# Patient Record
Sex: Female | Born: 1980 | Race: White | Hispanic: No | Marital: Married | Smoking: Never smoker
Health system: Southern US, Community
[De-identification: ages and names within clinical notes are randomized; demographics above are authoritative.]

## PROBLEM LIST (undated history)

## (undated) DIAGNOSIS — I341 Nonrheumatic mitral (valve) prolapse: Secondary | ICD-10-CM

## (undated) DIAGNOSIS — E785 Hyperlipidemia, unspecified: Secondary | ICD-10-CM

## (undated) DIAGNOSIS — I498 Other specified cardiac arrhythmias: Secondary | ICD-10-CM

## (undated) DIAGNOSIS — I951 Orthostatic hypotension: Secondary | ICD-10-CM

## (undated) DIAGNOSIS — G90A Postural orthostatic tachycardia syndrome (POTS): Secondary | ICD-10-CM

## (undated) DIAGNOSIS — R000 Tachycardia, unspecified: Secondary | ICD-10-CM

## (undated) HISTORY — DX: Hyperlipidemia, unspecified: E78.5

## (undated) HISTORY — PX: APPENDECTOMY: SHX54

---

## 2001-06-29 ENCOUNTER — Other Ambulatory Visit: Admission: RE | Admit: 2001-06-29 | Discharge: 2001-06-29 | Payer: Self-pay | Admitting: Gynecology

## 2002-07-01 ENCOUNTER — Other Ambulatory Visit: Admission: RE | Admit: 2002-07-01 | Discharge: 2002-07-01 | Payer: Self-pay | Admitting: Gynecology

## 2003-07-04 ENCOUNTER — Other Ambulatory Visit: Admission: RE | Admit: 2003-07-04 | Discharge: 2003-07-04 | Payer: Self-pay | Admitting: Gynecology

## 2004-07-10 ENCOUNTER — Other Ambulatory Visit: Admission: RE | Admit: 2004-07-10 | Discharge: 2004-07-10 | Payer: Self-pay | Admitting: Gynecology

## 2005-08-08 ENCOUNTER — Other Ambulatory Visit: Admission: RE | Admit: 2005-08-08 | Discharge: 2005-08-08 | Payer: Self-pay | Admitting: Gynecology

## 2006-08-21 ENCOUNTER — Other Ambulatory Visit: Admission: RE | Admit: 2006-08-21 | Discharge: 2006-08-21 | Payer: Self-pay | Admitting: Gynecology

## 2007-02-26 ENCOUNTER — Other Ambulatory Visit: Admission: RE | Admit: 2007-02-26 | Discharge: 2007-02-26 | Payer: Self-pay | Admitting: Gynecology

## 2007-10-01 ENCOUNTER — Other Ambulatory Visit: Admission: RE | Admit: 2007-10-01 | Discharge: 2007-10-01 | Payer: Self-pay | Admitting: Gynecology

## 2008-07-03 ENCOUNTER — Ambulatory Visit (HOSPITAL_COMMUNITY): Admission: RE | Admit: 2008-07-03 | Discharge: 2008-07-03 | Payer: Self-pay | Admitting: Family Medicine

## 2008-12-01 ENCOUNTER — Encounter: Payer: Self-pay | Admitting: Women's Health

## 2008-12-01 ENCOUNTER — Other Ambulatory Visit: Admission: RE | Admit: 2008-12-01 | Discharge: 2008-12-01 | Payer: Self-pay | Admitting: Gynecology

## 2008-12-01 ENCOUNTER — Ambulatory Visit: Payer: Self-pay | Admitting: Women's Health

## 2009-12-14 HISTORY — PX: TRANSTHORACIC ECHOCARDIOGRAM: SHX275

## 2010-03-07 IMAGING — CR DG CHEST 2V
2 series · 2 of 2 positions shown · non-contrast
Comparison: None

CLINICAL DATA: Cough and congestion with shortness of breath.

CHEST - 2 VIEW

[view not recorded (1 of 2)]
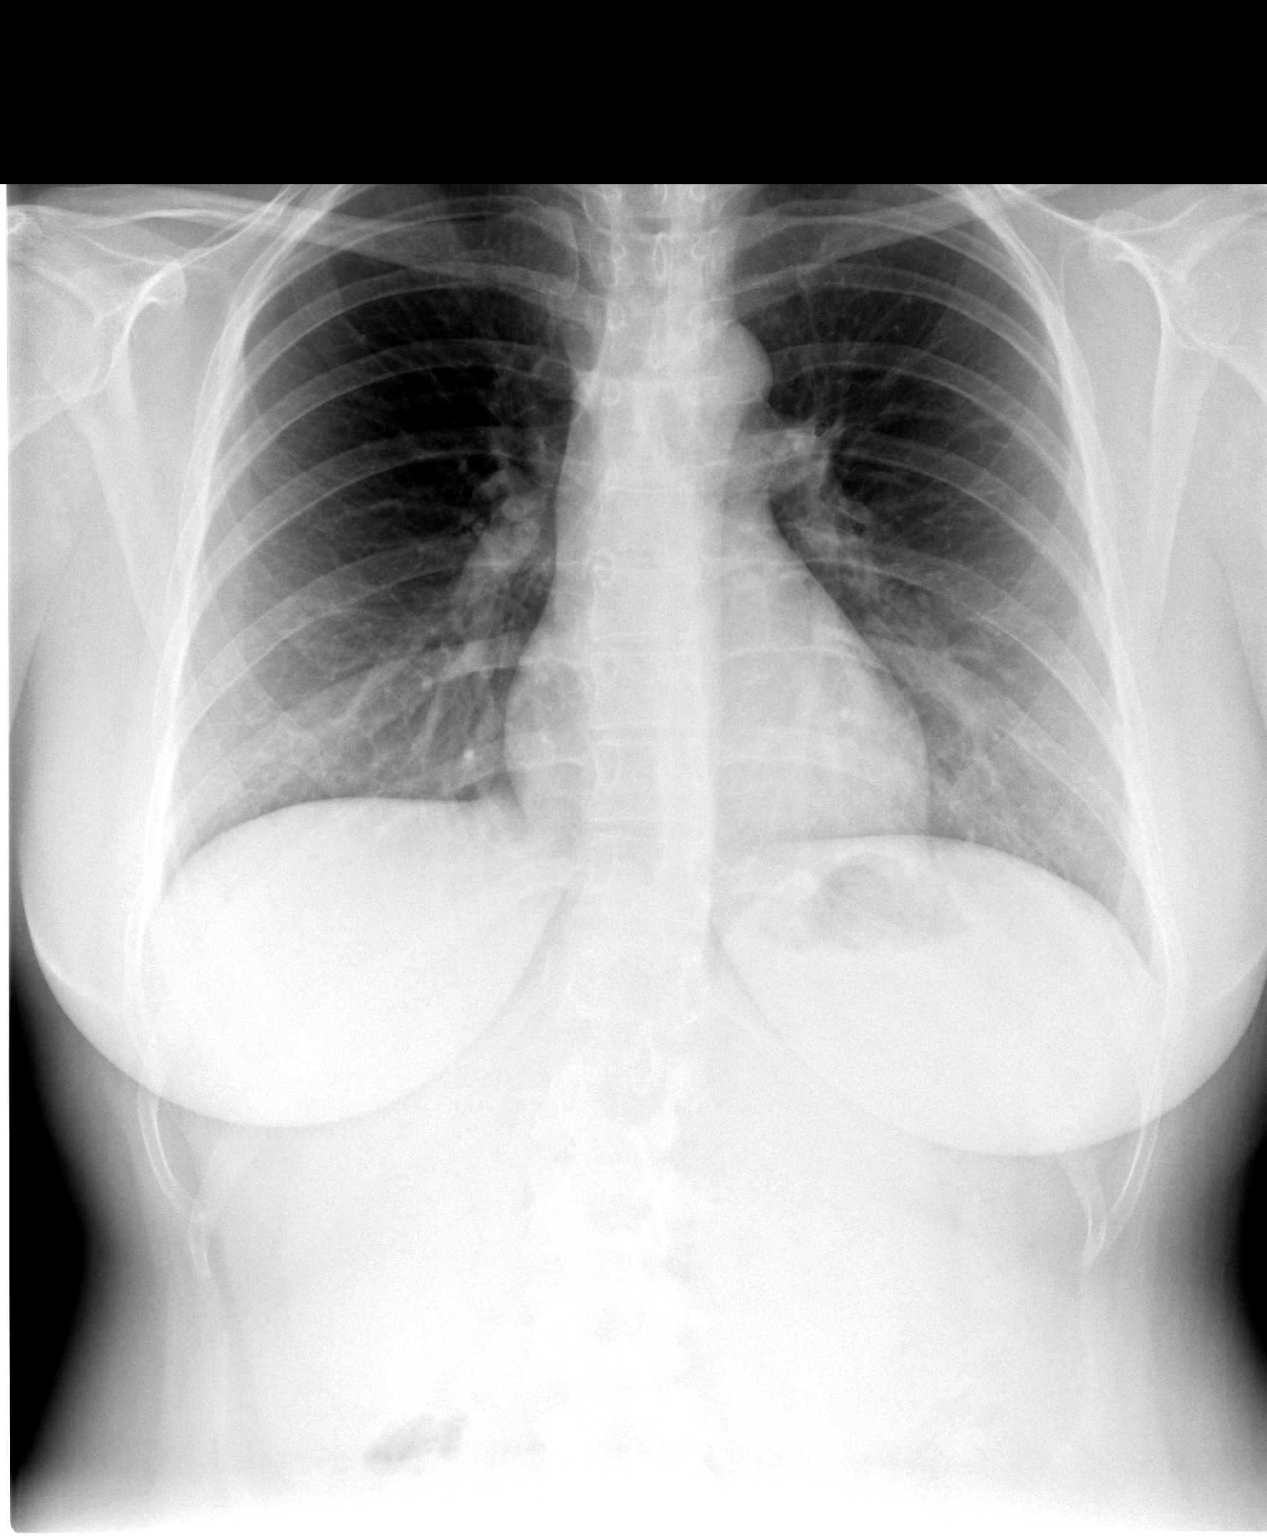

[view not recorded (2 of 2)]
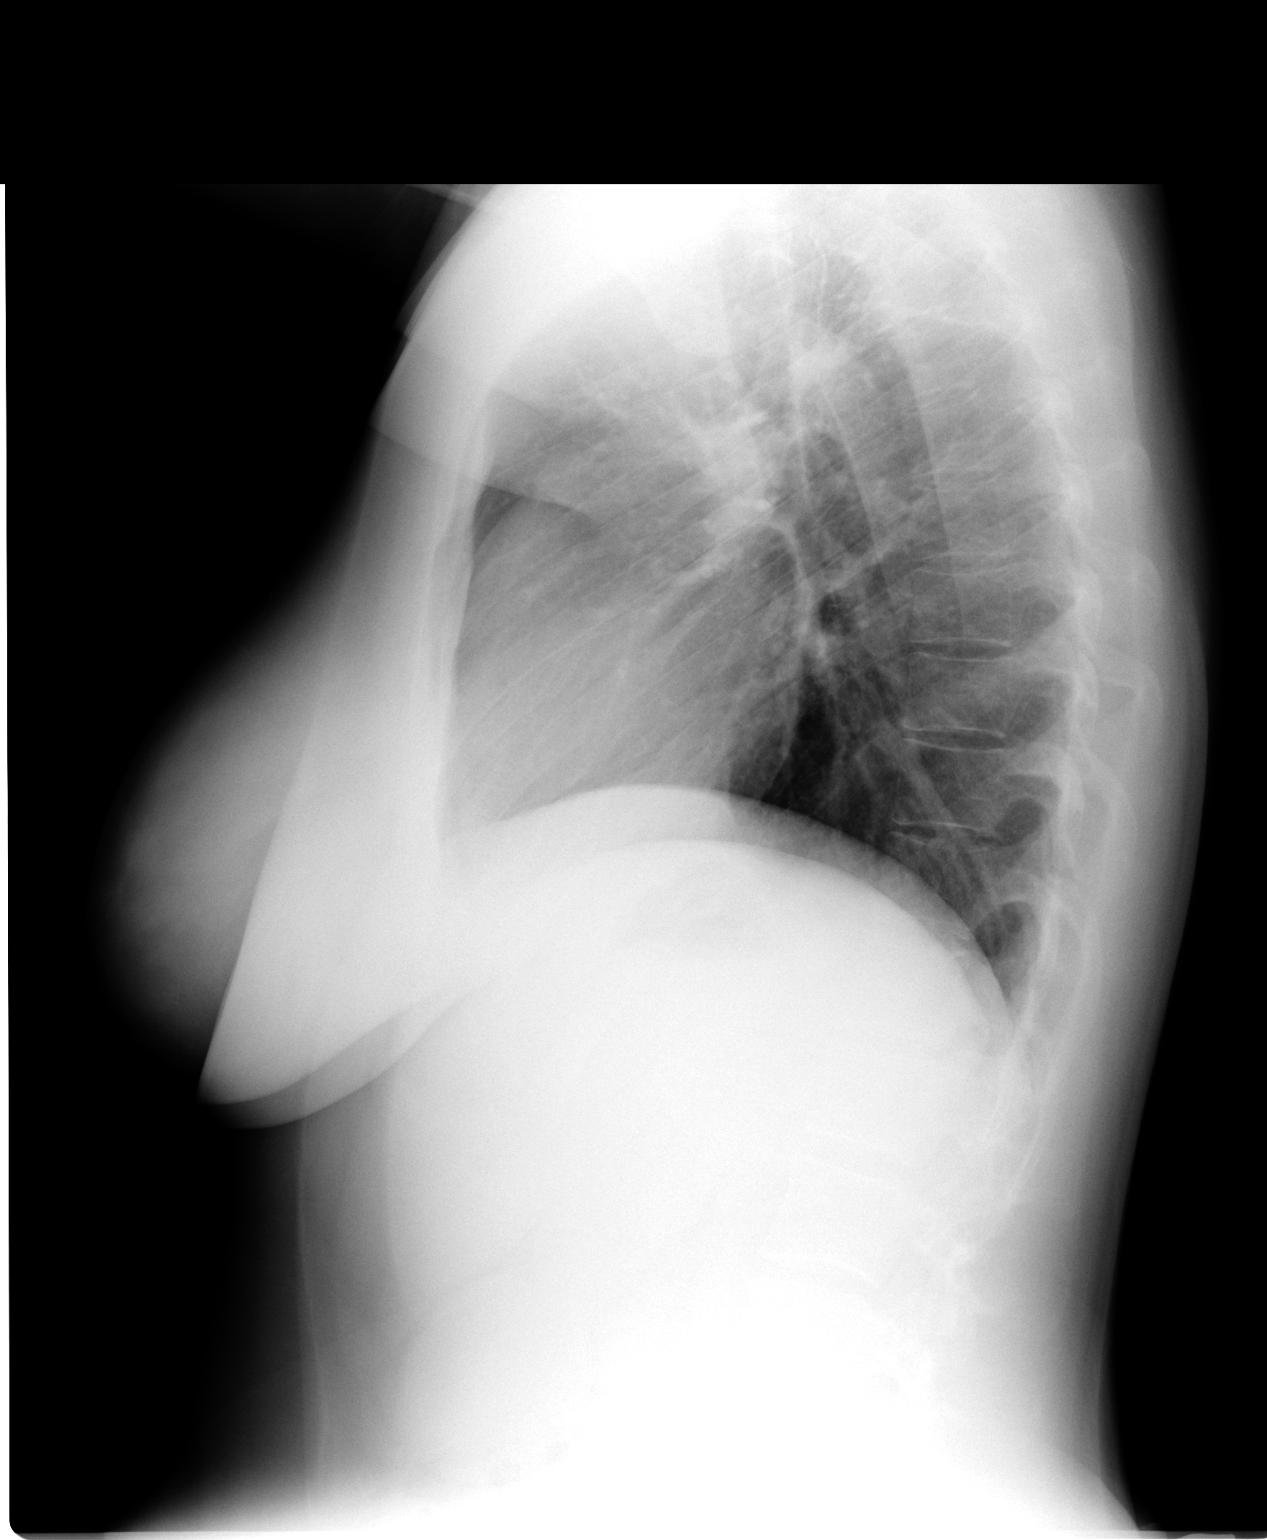

[2 of 2 positions shown; findings below may reference images not displayed]

FINDINGS: Trachea is midline.  Heart size normal.  Lungs are clear.
No pleural fluid.
IMPRESSION: No acute findings.

## 2011-05-02 LAB — OB RESULTS CONSOLE HIV ANTIBODY (ROUTINE TESTING): HIV: NONREACTIVE

## 2011-05-02 LAB — OB RESULTS CONSOLE ABO/RH: RH Type: POSITIVE

## 2011-05-02 LAB — OB RESULTS CONSOLE HEPATITIS B SURFACE ANTIGEN: Hepatitis B Surface Ag: NEGATIVE

## 2011-05-02 LAB — OB RESULTS CONSOLE RPR: RPR: NONREACTIVE

## 2011-06-17 NOTE — L&D Delivery Note (Signed)
Delivery Note At 10:39 PM a viable female was delivered via Vaginal, Spontaneous Delivery (Presentation: ;  ).  APGAR: , ; weight .   Placenta status: Intact, Spontaneous.  Cord:  with the following complications: .  Cord pH: not sent  Anesthesia: Epidural  Episiotomy: None Lacerations: superficial periurethral Suture Repair: 3.0 chromic Est. Blood Loss (mL): 300  Mom to postpartum.  Baby to nursery-stable.  Meriel Pica 12/08/2011, 10:48 PM

## 2011-09-24 ENCOUNTER — Encounter: Payer: BC Managed Care – PPO | Attending: Obstetrics and Gynecology | Admitting: *Deleted

## 2011-09-24 DIAGNOSIS — Z713 Dietary counseling and surveillance: Secondary | ICD-10-CM | POA: Insufficient documentation

## 2011-09-24 DIAGNOSIS — O99810 Abnormal glucose complicating pregnancy: Secondary | ICD-10-CM | POA: Insufficient documentation

## 2011-09-29 ENCOUNTER — Encounter: Payer: Self-pay | Admitting: *Deleted

## 2011-09-29 NOTE — Patient Instructions (Signed)
Goals:  Check glucose levels per MD as instructed  Follow Gestational Diabetes Diet as instructed  Call for follow-up as needed    

## 2011-09-29 NOTE — Progress Notes (Signed)
  Patient was seen on 09/24/2011 for Gestational Diabetes self-management class at the [facility]. The following learning objectives were met by the patient during this course:   States the definition of Gestational Diabetes  States why dietary management is important in controlling blood glucose  Describes the effects each nutrient has on blood glucose levels  Demonstrates ability to create a balanced meal plan  Demonstrates carbohydrate counting   States when to check blood glucose levels  Demonstrates proper blood glucose monitoring techniques  States the effect of stress and exercise on blood glucose levels  States the importance of limiting caffeine and abstaining from alcohol and smoking  Blood glucose monitor given:  One Touch Ultra Mini Self Monitoring Kit Lot # [id number] x Exp: 04/2012 Blood glucose reading: 97 mg/dl  Patient instructed to monitor glucose levels: FBS: 60 - <90 2 hour: <120  *Patient received handouts:  Nutrition Diabetes and Pregnancy  Carbohydrate Counting List  Patient will be seen for follow-up as needed.

## 2011-12-07 ENCOUNTER — Encounter (HOSPITAL_COMMUNITY): Payer: Self-pay | Admitting: *Deleted

## 2011-12-07 ENCOUNTER — Inpatient Hospital Stay (HOSPITAL_COMMUNITY)
Admission: AD | Admit: 2011-12-07 | Discharge: 2011-12-10 | DRG: 372 | Disposition: A | Discharge status: Home / Self Care | Payer: BC Managed Care – PPO | Attending: Obstetrics and Gynecology | Admitting: Obstetrics and Gynecology

## 2011-12-07 DIAGNOSIS — I059 Rheumatic mitral valve disease, unspecified: Secondary | ICD-10-CM | POA: Diagnosis present

## 2011-12-07 DIAGNOSIS — I2510 Atherosclerotic heart disease of native coronary artery without angina pectoris: Secondary | ICD-10-CM | POA: Diagnosis present

## 2011-12-07 DIAGNOSIS — E119 Type 2 diabetes mellitus without complications: Secondary | ICD-10-CM | POA: Diagnosis present

## 2011-12-07 DIAGNOSIS — O2432 Unspecified pre-existing diabetes mellitus in childbirth: Secondary | ICD-10-CM | POA: Diagnosis present

## 2011-12-07 HISTORY — DX: Tachycardia, unspecified: R00.0

## 2011-12-07 HISTORY — DX: Orthostatic hypotension: I95.1

## 2011-12-07 HISTORY — DX: Postural orthostatic tachycardia syndrome (POTS): G90.A

## 2011-12-07 HISTORY — DX: Nonrheumatic mitral (valve) prolapse: I34.1

## 2011-12-07 HISTORY — DX: Other specified cardiac arrhythmias: I49.8

## 2011-12-07 NOTE — MAU Note (Signed)
Pt report LOF since 2120

## 2011-12-07 NOTE — H&P (Signed)
31 year old G 1 at 74 w 4 days presents with questionable SROM tonight. No big gush . Mild contractions  Afebrile vss Fetal heart rate is reactive Toco irregular UCs Cervix fern neg slide x 2  Cervix 80/3 -2 No pool amniosure is performed  IMPRESSION: Rule out SROM PLAN: Amniosure If positive will admit  If negative, will ambulate for 1 to 2 hours

## 2011-12-08 ENCOUNTER — Encounter (HOSPITAL_COMMUNITY): Payer: Self-pay | Admitting: *Deleted

## 2011-12-08 ENCOUNTER — Encounter (HOSPITAL_COMMUNITY): Payer: Self-pay | Admitting: Anesthesiology

## 2011-12-08 ENCOUNTER — Inpatient Hospital Stay (HOSPITAL_COMMUNITY): Payer: BC Managed Care – PPO | Admitting: Anesthesiology

## 2011-12-08 LAB — CBC
Platelets: 203 10*3/uL (ref 150–400)
RBC: 4.02 MIL/uL (ref 3.87–5.11)
RDW: 13.7 % (ref 11.5–15.5)
WBC: 11.1 10*3/uL — ABNORMAL HIGH (ref 4.0–10.5)

## 2011-12-08 LAB — RPR: RPR Ser Ql: NONREACTIVE

## 2011-12-08 MED ORDER — FENTANYL 2.5 MCG/ML BUPIVACAINE 1/10 % EPIDURAL INFUSION (WH - ANES)
14.0000 mL/h | INTRAMUSCULAR | Status: DC
  Administered 2011-12-08 (×3): 14 mL/h via EPIDURAL
  Filled 2011-12-08 (×3): qty 60

## 2011-12-08 MED ORDER — OXYTOCIN 40 UNITS IN LACTATED RINGERS INFUSION - SIMPLE MED: 1.0000 m[IU]/min | INTRAVENOUS | Status: DC

## 2011-12-08 MED ORDER — OXYTOCIN BOLUS FROM INFUSION
250.0000 mL | Freq: Once | INTRAVENOUS | Status: DC
  Filled 2011-12-08: qty 500

## 2011-12-08 MED ORDER — PHENYLEPHRINE 40 MCG/ML (10ML) SYRINGE FOR IV PUSH (FOR BLOOD PRESSURE SUPPORT): 80.0000 ug | PREFILLED_SYRINGE | INTRAVENOUS | Status: DC | PRN

## 2011-12-08 MED ORDER — TERBUTALINE SULFATE 1 MG/ML IJ SOLN: 0.2500 mg | Freq: Once | INTRAMUSCULAR | Status: AC | PRN

## 2011-12-08 MED ORDER — SODIUM CHLORIDE 0.9 % IV SOLN
2.0000 g | Freq: Four times a day (QID) | INTRAVENOUS | Status: AC
  Administered 2011-12-09 (×2): 2 g via INTRAVENOUS
  Filled 2011-12-08 (×2): qty 2000

## 2011-12-08 MED ORDER — SODIUM CHLORIDE 0.9 % IV SOLN
2.0000 g | Freq: Once | INTRAVENOUS | Status: AC
  Administered 2011-12-08: 2 g via INTRAVENOUS
  Filled 2011-12-08: qty 2000

## 2011-12-08 MED ORDER — ACETAMINOPHEN 500 MG PO TABS
1000.0000 mg | ORAL_TABLET | Freq: Once | ORAL | Status: AC
  Administered 2011-12-08: 1000 mg via ORAL
  Filled 2011-12-08: qty 2

## 2011-12-08 MED ORDER — LACTATED RINGERS IV SOLN
INTRAVENOUS | Status: DC
  Administered 2011-12-07 – 2011-12-08 (×3): via INTRAVENOUS

## 2011-12-08 MED ORDER — OXYTOCIN 40 UNITS IN LACTATED RINGERS INFUSION - SIMPLE MED: 62.5000 mL/h | Freq: Once | INTRAVENOUS | Status: DC

## 2011-12-08 MED ORDER — CITRIC ACID-SODIUM CITRATE 334-500 MG/5ML PO SOLN: 30.0000 mL | ORAL | Status: DC | PRN

## 2011-12-08 MED ORDER — DIPHENHYDRAMINE HCL 50 MG/ML IJ SOLN: 12.5000 mg | INTRAMUSCULAR | Status: DC | PRN

## 2011-12-08 MED ORDER — ACETAMINOPHEN 325 MG PO TABS: 650.0000 mg | ORAL_TABLET | ORAL | Status: DC | PRN

## 2011-12-08 MED ORDER — FLEET ENEMA 7-19 GM/118ML RE ENEM: 1.0000 | ENEMA | RECTAL | Status: DC | PRN

## 2011-12-08 MED ORDER — LIDOCAINE HCL (PF) 1 % IJ SOLN
30.0000 mL | INTRAMUSCULAR | Status: DC | PRN
  Filled 2011-12-08: qty 30

## 2011-12-08 MED ORDER — PHENYLEPHRINE 40 MCG/ML (10ML) SYRINGE FOR IV PUSH (FOR BLOOD PRESSURE SUPPORT)
80.0000 ug | PREFILLED_SYRINGE | INTRAVENOUS | Status: DC | PRN
  Filled 2011-12-08: qty 5

## 2011-12-08 MED ORDER — ONDANSETRON HCL 4 MG/2ML IJ SOLN: 4.0000 mg | Freq: Four times a day (QID) | INTRAMUSCULAR | Status: DC | PRN

## 2011-12-08 MED ORDER — IBUPROFEN 600 MG PO TABS: 600.0000 mg | ORAL_TABLET | Freq: Four times a day (QID) | ORAL | Status: DC | PRN

## 2011-12-08 MED ORDER — OXYTOCIN 40 UNITS IN LACTATED RINGERS INFUSION - SIMPLE MED
1.0000 m[IU]/min | INTRAVENOUS | Status: DC
  Administered 2011-12-08: 2 m[IU]/min via INTRAVENOUS
  Administered 2011-12-08: 10 m[IU]/min via INTRAVENOUS
  Filled 2011-12-08: qty 1000

## 2011-12-08 MED ORDER — EPHEDRINE 5 MG/ML INJ
10.0000 mg | INTRAVENOUS | Status: DC | PRN
  Filled 2011-12-08: qty 4

## 2011-12-08 MED ORDER — LIDOCAINE HCL (PF) 1 % IJ SOLN
INTRAMUSCULAR | Status: DC | PRN
  Administered 2011-12-08 (×2): 5 mL

## 2011-12-08 MED ORDER — LACTATED RINGERS IV SOLN: 500.0000 mL | INTRAVENOUS | Status: DC | PRN

## 2011-12-08 MED ORDER — OXYCODONE-ACETAMINOPHEN 5-325 MG PO TABS: 1.0000 | ORAL_TABLET | ORAL | Status: DC | PRN

## 2011-12-08 MED ORDER — LACTATED RINGERS IV SOLN
500.0000 mL | Freq: Once | INTRAVENOUS | Status: AC
  Administered 2011-12-08: 500 mL via INTRAVENOUS

## 2011-12-08 MED ORDER — EPHEDRINE 5 MG/ML INJ: 10.0000 mg | INTRAVENOUS | Status: DC | PRN

## 2011-12-08 NOTE — Progress Notes (Signed)
amniosure was positive Patient will be admitted Contractions now every 3 Patient has history of POTS

## 2011-12-08 NOTE — Anesthesia Procedure Notes (Signed)
Epidural Patient location during procedure: OB Start time: 12/08/2011 2:50 PM  Staffing Anesthesiologist: Brayton Caves R Performed by: anesthesiologist   Preanesthetic Checklist Completed: patient identified, site marked, surgical consent, pre-op evaluation, timeout performed, IV checked, risks and benefits discussed and monitors and equipment checked  Epidural Patient position: sitting Prep: site prepped and draped and DuraPrep Patient monitoring: continuous pulse ox and blood pressure Approach: midline Injection technique: LOR air and LOR saline  Needle:  Needle type: Tuohy  Needle gauge: 17 G Needle length: 9 cm Needle insertion depth: 5 cm cm Catheter type: closed end flexible Catheter size: 19 Gauge Catheter at skin depth: 10 cm Test dose: negative  Assessment Events: blood not aspirated, injection not painful, no injection resistance, negative IV test and no paresthesia  Additional Notes Patient identified.  Risk benefits discussed including failed block, incomplete pain control, headache, nerve damage, paralysis, blood pressure changes, nausea, vomiting, reactions to medication both toxic or allergic, and postpartum back pain.  Patient expressed understanding and wished to proceed.  All questions were answered.  Sterile technique used throughout procedure and epidural site dressed with sterile barrier dressing. No paresthesia or other complications noted.The patient did not experience any signs of intravascular injection such as tinnitus or metallic taste in mouth nor signs of intrathecal spread such as rapid motor block. Please see nursing notes for vital signs.

## 2011-12-08 NOTE — Progress Notes (Signed)
Started pushing ~ 10 min ago, Temp 101>>started on Iv Ampicillin + PO Tylenol. Stable FHR, feels to be ROP @ +1

## 2011-12-08 NOTE — H&P (Signed)
Brittany Sampson is an 31 y.o. female. G1P0, adm with hx poss ROM, + amnisure, adm for SROM  Pertinent Gyneco Menstrual Histor No LMP recorded. Patient is pregnant.    Past Medical History  Diagnosis Date  . Hyperlipidemia   . Diabetes mellitus   . Mitral valve prolapse   . POTS (postural orthostatic tachycardia syndrome)   . Hyperlipidemia     Past Surgical History  Procedure Date  . Appendectomy   . Appendectomy     Family History  Problem Relation Age of Onset  . Other Neg Hx     Social History:  reports that she has never smoked. She does not have any smokeless tobacco history on file. She reports that she does not drink alcohol or use illicit drugs.  Allergies: No Known Allergies  Prescriptions prior to admission  Medication Sig Dispense Refill  . cetirizine (ZYRTEC) 10 MG tablet Take 10 mg by mouth daily.      . Prenatal Vit-Fe Fumarate-FA (PRENATAL MULTIVITAMIN) TABS Take 1 tablet by mouth at bedtime.        ROS  Blood pressure 123/70, pulse 99, temperature 98.5 F (36.9 C), temperature source Oral, resp. rate 18, height 5\' 3"  (1.6 m), weight 78.019 kg (172 lb). Physical Exam  Constitutional: She is oriented to person, place, and time. She appears well-developed and well-nourished.  HENT:  Head: Normocephalic and atraumatic.  Neck: Normal range of motion. Neck supple.  Cardiovascular: Normal rate and regular rhythm.   Respiratory: Effort normal and breath sounds normal.  GI:       Term FH, FHR 148  Genitourinary:       4/75%/-2/ AROM forebag  Musculoskeletal: Normal range of motion.  Neurological: She is alert and oriented to person, place, and time.    Results for orders placed during the hospital encounter of 12/07/11 (from the past 24 hour(s))  POCT FERN TEST     Status: Normal   Collection Time   12/07/11 11:30 PM      Component Value Range   Fern Test Negative    AMNISURE RUPTURE OF MEMBRANE (ROM)     Status: Normal   Collection Time   12/07/11 11:40 PM      Component Value Range   Amnisure ROM POSITIVE    CBC     Status: Abnormal   Collection Time   12/08/11 12:50 AM      Component Value Range   WBC 11.1 (*) 4.0 - 10.5 K/uL   RBC 4.02  3.87 - 5.11 MIL/uL   Hemoglobin 11.9 (*) 12.0 - 15.0 g/dL   HCT 47.8 (*) 29.5 - 62.1 %   MCV 86.8  78.0 - 100.0 fL   MCH 29.6  26.0 - 34.0 pg   MCHC 34.1  30.0 - 36.0 g/dL   RDW 30.8  65.7 - 84.6 %   Platelets 203  150 - 400 K/uL    No results found.  Assessment/Plan: SROM at term, - GBS, pit aug protocol discussed  Meriel Pica 12/08/2011, 7:42 AM

## 2011-12-08 NOTE — Anesthesia Preprocedure Evaluation (Signed)
Anesthesia Evaluation  Patient identified by MRN, date of birth, ID band Patient awake    Reviewed: Allergy & Precautions, H&P , Patient's Chart, lab work & pertinent test results  Airway Mallampati: II TM Distance: >3 FB Neck ROM: full    Dental No notable dental hx.    Pulmonary neg pulmonary ROS,  breath sounds clear to auscultation  Pulmonary exam normal       Cardiovascular negative cardio ROS  Rhythm:regular Rate:Normal     Neuro/Psych negative neurological ROS  negative psych ROS   GI/Hepatic negative GI ROS, Neg liver ROS,   Endo/Other  negative endocrine ROS  Renal/GU negative Renal ROS     Musculoskeletal   Abdominal   Peds  Hematology negative hematology ROS (+)   Anesthesia Other Findings Hyperlipidemia     Diabetes mellitus        Mitral valve prolapse     POTS (postural orthostatic tachycardia syndrome)        Hyperlipidemia    Reproductive/Obstetrics (+) Pregnancy                           Anesthesia Physical Anesthesia Plan  ASA: III  Anesthesia Plan: Epidural   Post-op Pain Management:    Induction:   Airway Management Planned:   Additional Equipment:   Intra-op Plan:   Post-operative Plan:   Informed Consent: I have reviewed the patients History and Physical, chart, labs and discussed the procedure including the risks, benefits and alternatives for the proposed anesthesia with the patient or authorized representative who has indicated his/her understanding and acceptance.     Plan Discussed with:   Anesthesia Plan Comments:         Anesthesia Quick Evaluation

## 2011-12-09 LAB — CBC
HCT: 32.5 % — ABNORMAL LOW (ref 36.0–46.0)
Hemoglobin: 11 g/dL — ABNORMAL LOW (ref 12.0–15.0)
MCH: 29.6 pg (ref 26.0–34.0)
MCHC: 33.8 g/dL (ref 30.0–36.0)

## 2011-12-09 LAB — TYPE AND SCREEN: Antibody Screen: NEGATIVE

## 2011-12-09 MED ORDER — FLEET ENEMA 7-19 GM/118ML RE ENEM: 1.0000 | ENEMA | Freq: Every day | RECTAL | Status: DC | PRN

## 2011-12-09 MED ORDER — DIPHENHYDRAMINE HCL 25 MG PO CAPS: 25.0000 mg | ORAL_CAPSULE | Freq: Four times a day (QID) | ORAL | Status: DC | PRN

## 2011-12-09 MED ORDER — DIBUCAINE 1 % RE OINT: 1.0000 "application " | TOPICAL_OINTMENT | RECTAL | Status: DC | PRN

## 2011-12-09 MED ORDER — PRENATAL MULTIVITAMIN CH
1.0000 | ORAL_TABLET | Freq: Every day | ORAL | Status: DC
  Administered 2011-12-09: 1 via ORAL
  Filled 2011-12-09 (×2): qty 1

## 2011-12-09 MED ORDER — IBUPROFEN 800 MG PO TABS
800.0000 mg | ORAL_TABLET | Freq: Three times a day (TID) | ORAL | Status: DC | PRN
  Administered 2011-12-09 – 2011-12-10 (×4): 800 mg via ORAL
  Filled 2011-12-09 (×4): qty 1

## 2011-12-09 MED ORDER — ONDANSETRON HCL 4 MG PO TABS: 4.0000 mg | ORAL_TABLET | ORAL | Status: DC | PRN

## 2011-12-09 MED ORDER — OXYCODONE-ACETAMINOPHEN 5-325 MG PO TABS: 1.0000 | ORAL_TABLET | Freq: Four times a day (QID) | ORAL | Status: DC | PRN

## 2011-12-09 MED ORDER — TETANUS-DIPHTH-ACELL PERTUSSIS 5-2.5-18.5 LF-MCG/0.5 IM SUSP: 0.5000 mL | Freq: Once | INTRAMUSCULAR | Status: DC

## 2011-12-09 MED ORDER — SIMETHICONE 80 MG PO CHEW: 80.0000 mg | CHEWABLE_TABLET | ORAL | Status: DC | PRN

## 2011-12-09 MED ORDER — BENZOCAINE-MENTHOL 20-0.5 % EX AERO
1.0000 "application " | INHALATION_SPRAY | CUTANEOUS | Status: DC | PRN
  Administered 2011-12-09: 1 via TOPICAL
  Filled 2011-12-09: qty 56

## 2011-12-09 MED ORDER — SENNOSIDES-DOCUSATE SODIUM 8.6-50 MG PO TABS
2.0000 | ORAL_TABLET | Freq: Every day | ORAL | Status: DC
  Administered 2011-12-09: 2 via ORAL

## 2011-12-09 MED ORDER — ONDANSETRON HCL 4 MG/2ML IJ SOLN: 4.0000 mg | INTRAMUSCULAR | Status: DC | PRN

## 2011-12-09 MED ORDER — MEASLES, MUMPS & RUBELLA VAC ~~LOC~~ INJ
0.5000 mL | INJECTION | Freq: Once | SUBCUTANEOUS | Status: DC
  Filled 2011-12-09: qty 0.5

## 2011-12-09 MED ORDER — ZOLPIDEM TARTRATE 5 MG PO TABS: 5.0000 mg | ORAL_TABLET | Freq: Every evening | ORAL | Status: DC | PRN

## 2011-12-09 MED ORDER — BISACODYL 10 MG RE SUPP: 10.0000 mg | Freq: Every day | RECTAL | Status: DC | PRN

## 2011-12-09 MED ORDER — WITCH HAZEL-GLYCERIN EX PADS
1.0000 "application " | MEDICATED_PAD | CUTANEOUS | Status: DC | PRN
  Administered 2011-12-09: 1 via TOPICAL

## 2011-12-09 MED ORDER — LANOLIN HYDROUS EX OINT: TOPICAL_OINTMENT | CUTANEOUS | Status: DC | PRN

## 2011-12-09 NOTE — Anesthesia Postprocedure Evaluation (Signed)
  Anesthesia Post-op Note  Patient: Brittany Sampson  Procedure(s) Performed: * No surgery found *  Patient Location: Mother/Baby  Anesthesia Type: Epidural  Level of Consciousness: awake  Airway and Oxygen Therapy: Patient Spontanous Breathing  Post-op Pain: none  Post-op Assessment: Post-op Vital signs reviewed  Post-op Vital Signs: Reviewed and stable  Complications: No apparent anesthesia complications

## 2011-12-09 NOTE — Clinical Social Work Psychosocial (Signed)
     Clinical Social Work Department BRIEF PSYCHOSOCIAL ASSESSMENT 12/09/2011  Patient:  Brittany Sampson, Brittany Sampson     Account Number:  [id number]     Admit date:  12/07/2011  Clinical Social Worker:  Andy Gauss  Date/Time:  12/09/2011 11:00 AM  Referred by:  Physician  Date Referred:  12/09/2011 Referred for  Behavioral Health Issues   Other Referral:   Interview type:  Family Other interview type:    PSYCHOSOCIAL DATA Living Status:  HUSBAND Admitted from facility:   Level of care:   Primary support name:  Bettina Warn Primary support relationship to patient:  SPOUSE Degree of support available:   Involved    CURRENT CONCERNS Current Concerns  Behavioral Health Issues   Other Concerns:    SOCIAL WORK ASSESSMENT / PLAN Sw referral received to assess history of anxiety.  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 Pt states she is no longer required to travel and therefore anxiety symptoms resolved.  Pt was prescribed medication but has not taking any in "over a year."  She reports having good support at home.  Pt appears to be happy, as Sw observed pt and FOB bonding well with the infant.  Sw available to assist further if needed.   Assessment/plan status:   Other assessment/ plan:   Information/referral to community resources:   PP depression symptoms discussed briefly.  Pt agrees to follow up with medical provider if needed.    PATIENTS/FAMILYS RESPONSE TO PLAN OF CARE: Pt and spouse thanked Sw for consult.

## 2011-12-09 NOTE — Anesthesia Postprocedure Evaluation (Signed)
  Anesthesia Post-op Note  Patient: Brittany Sampson  Procedure(s) Performed: * No surgery found *  Patient Location: Mother/Baby  Anesthesia Type: Epidural  Level of Consciousness: awake  Airway and Oxygen Therapy: Patient Spontanous Breathing  Post-op Pain: none  Post-op Assessment: Post-op Vital signs reviewed and Patient's Cardiovascular Status Stable  Post-op Vital Signs: Reviewed and stable  Complications: No apparent anesthesia complications

## 2011-12-09 NOTE — Progress Notes (Signed)
Post Partum Day 1 Subjective: no complaints, up ad lib, voiding, tolerating PO, + flatus and denies HA, blurred vision. No problem with elevated BP during  pregnancy  Objective: Blood pressure 130/88, pulse 114, temperature 98.5 F (36.9 C), temperature source Oral, resp. rate 16, height 5\' 3"  (1.6 m), weight 78.019 kg (172 lb), SpO2 98.00%, unknown if currently breastfeeding.  Physical Exam:  General: alert and cooperative Lochia: appropriate Uterine Fundus: firm Incision: perineum intact DVT Evaluation: No evidence of DVT seen on physical exam. DTR's 3+ no clonus, no pedal edema observed  Basename 12/09/11 0535 12/08/11 0050  HGB 11.0* 11.9*  HCT 32.5* 34.9*    Assessment/Plan: Plan for discharge tomorrow CBC and CMP in am.  Continue ATB's for 24 hrs   LOS: 2 days   CURTIS,CAROL G 12/09/2011, 8:12 AM

## 2011-12-10 LAB — CBC
HCT: 29 % — ABNORMAL LOW (ref 36.0–46.0)
MCHC: 33.1 g/dL (ref 30.0–36.0)
MCV: 88.1 fL (ref 78.0–100.0)
RDW: 14 % (ref 11.5–15.5)

## 2011-12-10 LAB — COMPREHENSIVE METABOLIC PANEL
Albumin: 2.2 g/dL — ABNORMAL LOW (ref 3.5–5.2)
BUN: 14 mg/dL (ref 6–23)
Creatinine, Ser: 0.57 mg/dL (ref 0.50–1.10)
Total Protein: 5.5 g/dL — ABNORMAL LOW (ref 6.0–8.3)

## 2011-12-10 MED ORDER — IBUPROFEN 800 MG PO TABS: 800.0000 mg | ORAL_TABLET | Freq: Three times a day (TID) | ORAL | Status: AC | PRN

## 2011-12-10 NOTE — Progress Notes (Signed)
Post Partum Day 2 Subjective: no complaints, up ad lib, voiding, tolerating PO and + flatus  Objective: Blood pressure 112/73, pulse 87, temperature 98.3 F (36.8 C), temperature source Oral, resp. rate 18, height 5\' 3"  (1.6 m), weight 78.019 kg (172 lb), SpO2 98.00%, unknown if currently breastfeeding.  Physical Exam:  General: alert and cooperative Lochia: appropriate Uterine Fundus: firm Incision: perineum intact, small hemorrhoids DVT Evaluation: No evidence of DVT seen on physical exam.   Basename 12/10/11 0530 12/09/11 0535  HGB 9.6* 11.0*  HCT 29.0* 32.5*    Assessment/Plan: Discharge home   LOS: 3 days   Brittany Sampson G 12/10/2011, 7:59 AM

## 2011-12-10 NOTE — Discharge Summary (Signed)
Obstetric Discharge Summary Reason for Admission: rupture of membranes Prenatal Procedures: ultrasound Intrapartum Procedures: spontaneous vaginal delivery Postpartum Procedures: antibiotics Complications-Operative and Postpartum: early endometritis Hemoglobin  Date Value Range Status  12/10/2011 9.6* 12.0 - 15.0 Sampson/dL Final     HCT  Date Value Range Status  12/10/2011 29.0* 36.0 - 46.0 % Final    Physical Exam:  General: alert and cooperative Lochia: appropriate Uterine Fundus: firm Incision: perineum intact, small hemorrhoids DVT Evaluation: No evidence of DVT seen on physical exam.  Discharge Diagnoses: Term Pregnancy-delivered  Discharge Information: Date: 12/10/2011 Activity: pelvic rest Diet: routine Medications: PNV and Ibuprofen Condition: improved Instructions: refer to practice specific booklet Discharge to: home   Newborn Data: Live born female  Birth Weight: 6 lb 10 oz (3005 Sampson) APGAR: 9, 9  Home with mother.  Brittany Sampson 12/10/2011, 8:10 AM

## 2013-04-25 ENCOUNTER — Other Ambulatory Visit: Payer: Self-pay | Admitting: *Deleted

## 2013-04-25 MED ORDER — FENOFIBRATE 145 MG PO TABS: 145.0000 mg | ORAL_TABLET | Freq: Every day | ORAL | Status: DC

## 2013-07-15 ENCOUNTER — Telehealth: Payer: Self-pay | Admitting: *Deleted

## 2013-07-15 MED ORDER — FENOFIBRATE 145 MG PO TABS: 145.0000 mg | ORAL_TABLET | Freq: Every day | ORAL | Status: DC

## 2013-07-15 NOTE — Telephone Encounter (Signed)
Returned patient's call and notified that I will send a rx into her pharmacy to last until her appt in March.   Rx was sent to pharmacy electronically.

## 2013-07-15 NOTE — Telephone Encounter (Signed)
Pt has been out of her Tricor for over a month. She has an appointment with Dr. Salena Saner on March 6th. She stated that she did not know if he wanted her to continue to take it or not.  MC

## 2013-07-18 ENCOUNTER — Telehealth: Payer: Self-pay | Admitting: Cardiovascular Disease

## 2013-07-18 NOTE — Telephone Encounter (Signed)
Her insurance will not pay for Fenofibrate 145 mg,want to know if she can change to Gemfibraziil 600 mg please?

## 2013-07-18 NOTE — Telephone Encounter (Signed)
Called pharmacy.Marland Kitchen Appears that fenofibrate 154m is running thru insurance but that patient will have to pay full cost. Was unable to obtain information from pharmacy staff if a prior authorization is needed or that maybe patient has a deductible needing to be met.   Called patient & left VM with this information and inquired if she knows any more information/can contact insurance company to see if this will be covered.   Message sent to Dr. CLurline Delnurse BPamala Hurryas FJuluis Rainier

## 2013-07-18 NOTE — Telephone Encounter (Signed)
Spoke with patient. Patient informed RN that she was told fenofibrate was not preferred but that fenofibric acid was preferred. Seemed like there was confusion on information provided to RN & patient. Patient stated she does not have prescription drug deductible and that this medication works for her & she would like to stay on it if possible. informed patient that it may be an issues of it not being preferred, so it is not falling into one of her co-pay categroies and instead may be a 10% of price charge (i.e. She would have to pay $55 for the medication - she stated this was the cost)  [facility]. [facility] staff stated that when running the drug, it did not appear that a prior authorization was needed, but a message stating this was not a preferred drug. Staff provided RN with express scripts number [id number].   Will defer to B. Lassiter to address.

## 2013-07-20 NOTE — Telephone Encounter (Signed)
Has an appt 08/19/13.  Will discuss Fenofibrate/Gemfibrozil with Dr. Salena Saner at this visit.  Insurance formulary is Gemfibrozil.

## 2013-07-25 ENCOUNTER — Other Ambulatory Visit: Payer: Self-pay | Admitting: *Deleted

## 2013-07-25 MED ORDER — FENOFIBRATE 160 MG PO TABS: 160.0000 mg | ORAL_TABLET | Freq: Every day | ORAL | Status: DC

## 2013-07-25 NOTE — Telephone Encounter (Signed)
Notified patient Fenofibrate has been changed to 160mg .  BUT - she thinks this is too high and doesn't want to take. Suggested she hold off picking it up and discuss with Dr. Salena Saner at the office visit.

## 2013-08-19 ENCOUNTER — Ambulatory Visit (INDEPENDENT_AMBULATORY_CARE_PROVIDER_SITE_OTHER): Payer: No Typology Code available for payment source | Admitting: Cardiovascular Disease

## 2013-08-19 ENCOUNTER — Encounter: Payer: Self-pay | Admitting: Cardiovascular Disease

## 2013-08-19 VITALS — BP 136/90 | HR 95 | Resp 20 | Ht 63.0 in | Wt 149.1 lb

## 2013-08-19 DIAGNOSIS — E781 Pure hyperglyceridemia: Secondary | ICD-10-CM

## 2013-08-19 DIAGNOSIS — G90A Postural orthostatic tachycardia syndrome (POTS): Secondary | ICD-10-CM

## 2013-08-19 DIAGNOSIS — I498 Other specified cardiac arrhythmias: Secondary | ICD-10-CM

## 2013-08-19 DIAGNOSIS — I951 Orthostatic hypotension: Secondary | ICD-10-CM

## 2013-08-19 DIAGNOSIS — R000 Tachycardia, unspecified: Secondary | ICD-10-CM

## 2013-08-19 NOTE — Patient Instructions (Signed)
Your physician recommends that you schedule a follow-up appointment in: ONE YEAR with Dr.Croitoru  

## 2013-08-21 ENCOUNTER — Encounter: Payer: Self-pay | Admitting: Cardiovascular Disease

## 2013-08-21 DIAGNOSIS — G90A Postural orthostatic tachycardia syndrome (POTS): Secondary | ICD-10-CM | POA: Insufficient documentation

## 2013-08-21 DIAGNOSIS — R000 Tachycardia, unspecified: Secondary | ICD-10-CM

## 2013-08-21 DIAGNOSIS — I951 Orthostatic hypotension: Secondary | ICD-10-CM

## 2013-08-21 DIAGNOSIS — E781 Pure hyperglyceridemia: Secondary | ICD-10-CM | POA: Insufficient documentation

## 2013-08-21 NOTE — Progress Notes (Signed)
Patient ID: Brittany Sampson, female   DOB: 12/14/1980, 33 y.o.   MRN: [id number]     Reason for office visit Postural orthostatic tachycardia syndrome  Brittany Sampson is tolerating her POTS quite well. She is not troubled by palpitations, dizziness, breathlessness or syncope. At work she'll sometimes check her heart rate with a pulse oximeter and find her heart rate to be in the 120s (she works in a pediatric office). This is not associated with any symptoms. She also has hypertriglyceridemia for which he takes fenofibrate with good results. It has been roughly 2 years since her last pregnancy. She is taken an oral contraceptive   No Known Allergies  Current Outpatient Prescriptions  Medication Sig Dispense Refill  . fenofibrate 160 MG tablet Take 1 tablet (160 mg total) by mouth daily.  30 tablet  0  . ALPRAZolam (XANAX) 0.5 MG tablet Take 0.5 mg by mouth as needed.      Brittany Sampson 0.18/0.215/0.25 MG-35 MCG tablet daily.       No current facility-administered medications for this visit.    Past Medical History  Diagnosis Date  . Hyperlipidemia   . Diabetes mellitus   . Mitral valve prolapse   . POTS (postural orthostatic tachycardia syndrome)   . Hyperlipidemia     Past Surgical History  Procedure Laterality Date  . Appendectomy    . Appendectomy      Family History  Problem Relation Age of Onset  . Other Neg Hx     History   Social History  . Marital Status: Married    Spouse Name: N/A    Number of Children: N/A  . Years of Education: N/A   Occupational History  . Not on file.   Social History Main Topics  . Smoking status: Never Smoker   . Smokeless tobacco: Not on file  . Alcohol Use: No  . Drug Use: No  . Sexual Activity: Yes   Other Topics Concern  . Not on file   Social History Narrative  . No narrative on file    Review of systems: The patient specifically denies any chest pain at rest or with exertion, dyspnea at rest or with exertion, orthopnea,  paroxysmal nocturnal dyspnea, syncope, palpitations, focal neurological deficits, intermittent claudication, lower extremity edema, unexplained weight gain, cough, hemoptysis or wheezing.  The patient also denies abdominal pain, nausea, vomiting, dysphagia, diarrhea, constipation, polyuria, polydipsia, dysuria, hematuria, frequency, urgency, abnormal bleeding or bruising, fever, chills, unexpected weight changes, mood swings, change in skin or hair texture, change in voice quality, auditory or visual problems, allergic reactions or rashes, new musculoskeletal complaints other than usual "aches and pains".   PHYSICAL EXAM BP 136/90  Pulse 95  Resp 20  Ht 5\' 3"  (1.6 m)  Wt 67.631 kg (149 lb 1.6 oz)  BMI 26.42 kg/m2  General: Alert, oriented x3, no distress Head: no evidence of trauma, PERRL, EOMI, no exophtalmos or lid lag, no myxedema, no xanthelasma; normal ears, nose and oropharynx Neck: normal jugular venous pulsations and no hepatojugular reflux; brisk carotid pulses without delay and no carotid bruits Chest: clear to auscultation, no signs of consolidation by percussion or palpation, normal fremitus, symmetrical and full respiratory excursions Cardiovascular: normal position and quality of the apical impulse, regular rhythm, normal first and second heart sounds, no murmurs, rubs or gallops Abdomen: no tenderness or distention, no masses by palpation, no abnormal pulsatility or arterial bruits, normal bowel sounds, no hepatosplenomegaly Extremities: no clubbing, cyanosis or edema; 2+ radial, ulnar and  brachial pulses bilaterally; 2+ right femoral, posterior tibial and dorsalis pedis pulses; 2+ left femoral, posterior tibial and dorsalis pedis pulses; no subclavian or femoral bruits Neurological: grossly nonfocal   EKG: Sinus rhythm  Lipid Panel  No results found for this basename: chol, trig, hdl, cholhdl, vldl, ldlcalc    BMET    Component Value Date/Time   NA 138 12/10/2011 0530     K 3.7 12/10/2011 0530   CL 105 12/10/2011 0530   CO2 23 12/10/2011 0530   GLUCOSE 79 12/10/2011 0530   BUN 14 12/10/2011 0530   CREATININE 0.57 12/10/2011 0530   CALCIUM 9.4 12/10/2011 0530   GFRNONAA >90 12/10/2011 0530   GFRAA >90 12/10/2011 0530     ASSESSMENT AND PLAN POTS (postural orthostatic tachycardia syndrome) She is tolerating this well and does not appear to require specific therapy. She is reminded to stay well hydrated and avoid prolonged orthostasis without moving. She has borderline high blood pressure and sodium loading should be done cautiously. Weight loss is recommended since she has a propensity to develop diabetes and has many features suggestive of the metabolic syndrome  Hypertriglyceridemia His time to repeat her lipid profile and liver function tests   Orders Placed This Encounter  Procedures  . CBC  . Lipid panel  . TSH  . COMPLETE METABOLIC PANEL WITH GFR  . EKG 12-Lead   Meds ordered this encounter  Medications  . ALPRAZolam (XANAX) 0.5 MG tablet    Sig: Take 0.5 mg by mouth as needed.  Brittany Sampson 0.18/0.215/0.25 MG-35 MCG tablet    Sig: daily.    Junious SilkROITORU,Reda Citron  Oley Lahaie, MD, [facility] HospitalFACC [facility] [phone] office [phone] pager

## 2013-08-21 NOTE — Assessment & Plan Note (Signed)
She is tolerating this well and does not appear to require specific therapy. She is reminded to stay well hydrated and avoid prolonged orthostasis without moving. She has borderline high blood pressure and sodium loading should be done cautiously. Weight loss is recommended since she has a propensity to develop diabetes and has many features suggestive of the metabolic syndrome

## 2013-08-21 NOTE — Assessment & Plan Note (Signed)
His time to repeat her lipid profile and liver function tests

## 2013-10-18 ENCOUNTER — Other Ambulatory Visit: Payer: Self-pay

## 2013-10-18 MED ORDER — FENOFIBRATE 160 MG PO TABS: 160.0000 mg | ORAL_TABLET | Freq: Every day | ORAL | Status: DC

## 2013-10-18 NOTE — Telephone Encounter (Signed)
Rx was sent to pharmacy electronically. 

## 2014-01-09 ENCOUNTER — Telehealth: Payer: Self-pay | Admitting: Cardiovascular Disease

## 2014-01-09 NOTE — Telephone Encounter (Signed)
Pt need to know if she need to be pre medicated before getting her teeth cleaned? She has mitral valve prolapse.

## 2014-01-10 NOTE — Telephone Encounter (Signed)
Forward to Dr Juliene Pinaroitoru willdefer for his answer

## 2014-01-10 NOTE — Telephone Encounter (Signed)
Spoke to patient .no antibiotic are needed per Dr Royann Shiversroitoru.  information given and patient verbalized understanding

## 2014-01-10 NOTE — Telephone Encounter (Signed)
No, teeth cleaning does not demand antibiotics. In fact, current guidelines do not recommend antibiotics for patients with MVP for any dental procedures, even extractions.

## 2014-04-17 ENCOUNTER — Encounter: Payer: Self-pay | Admitting: Cardiovascular Disease

## 2014-06-12 ENCOUNTER — Telehealth: Payer: Self-pay | Admitting: Cardiovascular Disease

## 2014-06-12 DIAGNOSIS — E785 Hyperlipidemia, unspecified: Secondary | ICD-10-CM

## 2014-06-12 NOTE — Telephone Encounter (Signed)
Brittany BienenstockBrandy is calling because she has a question about her medication Fenofibrate . Please call

## 2014-06-12 NOTE — Telephone Encounter (Signed)
Patient called asking about possible Fenofibrate dose change or to see if she could take every other day due to cost change with insurance. Told her I would refer this to Dr. Royann Shiversroitoru but that for now to keep taking meds as prescribed. She does not have a recent lipid panel result so I will order another in advance of her 08/25/14 appt. She is also having her PCP fax over recent labwork performed.

## 2014-06-12 NOTE — Telephone Encounter (Signed)
Duplicate encounter, addressed w/ pt in previous telephone encounter.

## 2014-06-12 NOTE — Telephone Encounter (Signed)
Please call,question about her Fenofibrate 160 mg.

## 2014-06-12 NOTE — Telephone Encounter (Signed)
Yes, either the 145 or 150 mg doses are OK if they are less expensive

## 2014-06-13 MED ORDER — FENOFIBRATE 145 MG PO TABS: 145.0000 mg | ORAL_TABLET | Freq: Every day | ORAL | Status: DC

## 2014-06-13 NOTE — Addendum Note (Signed)
Addended by: Ronnell GuadalajaraLASSITER, Dayami Taitt A on: 06/13/2014 12:18 PM   Modules accepted: Orders

## 2014-06-13 NOTE — Telephone Encounter (Signed)
Discussed options with patient.  Pharmacist ran the 145mg  Fenofibrate through and the co-pay is 43.50 which patient says is doable. Just picked up a new Rx for the 160mg .  Will let us know if she has any problems when she gets the new dose filled.  Patient voiced understanding.

## 2014-08-15 ENCOUNTER — Telehealth: Payer: Self-pay | Admitting: Cardiovascular Disease

## 2014-08-16 NOTE — Telephone Encounter (Signed)
Closed encounter °

## 2014-08-25 ENCOUNTER — Ambulatory Visit: Payer: No Typology Code available for payment source | Admitting: Cardiovascular Disease

## 2014-09-11 ENCOUNTER — Encounter: Payer: Self-pay | Admitting: *Deleted

## 2014-09-25 ENCOUNTER — Telehealth: Payer: Self-pay | Admitting: Cardiovascular Disease

## 2014-09-25 ENCOUNTER — Encounter: Payer: Self-pay | Admitting: Cardiovascular Disease

## 2014-09-27 NOTE — Telephone Encounter (Signed)
Closed encounter °

## 2014-10-06 ENCOUNTER — Ambulatory Visit: Payer: No Typology Code available for payment source | Admitting: Cardiovascular Disease

## 2014-10-27 ENCOUNTER — Encounter: Payer: Self-pay | Admitting: *Deleted

## 2014-11-03 ENCOUNTER — Ambulatory Visit (INDEPENDENT_AMBULATORY_CARE_PROVIDER_SITE_OTHER): Payer: 59 | Admitting: Cardiovascular Disease

## 2014-11-03 ENCOUNTER — Encounter: Payer: Self-pay | Admitting: Cardiovascular Disease

## 2014-11-03 VITALS — BP 130/86 | HR 96 | Ht 62.0 in | Wt 155.2 lb

## 2014-11-03 DIAGNOSIS — I341 Nonrheumatic mitral (valve) prolapse: Secondary | ICD-10-CM | POA: Insufficient documentation

## 2014-11-03 DIAGNOSIS — I951 Orthostatic hypotension: Secondary | ICD-10-CM

## 2014-11-03 DIAGNOSIS — I4581 Long QT syndrome: Secondary | ICD-10-CM | POA: Diagnosis not present

## 2014-11-03 DIAGNOSIS — R000 Tachycardia, unspecified: Secondary | ICD-10-CM | POA: Diagnosis not present

## 2014-11-03 DIAGNOSIS — G90A Postural orthostatic tachycardia syndrome (POTS): Secondary | ICD-10-CM

## 2014-11-03 DIAGNOSIS — R9431 Abnormal electrocardiogram [ECG] [EKG]: Secondary | ICD-10-CM | POA: Insufficient documentation

## 2014-11-03 NOTE — Progress Notes (Signed)
Patient ID: Brittany HolmesBrandy Sampson, female   DOB: 12/31/1980, 34 y.o.   MRN: [id number]     Cardiology Office Note   Date:  11/03/2014   ID:  Brittany Sampson, DOB 08/21/1980, MRN [id number]  PCP:  Doreatha MartinVELAZQUEZ,GRETCHEN, MD  Cardiologist:   Thurmon FairROITORU,Donnette Macmullen, MD   Chief Complaint  Patient presents with  . Annual Exam    No complaints of chest pain, SOB , edema or dizziness.      History of Present Illness: Brittany Sampson is a 34 y.o. female who presents for follow-up of postural orthostatic tachycardia syndrome and mitral valve prolapse without regurgitation. She feels great. She has noticed that when she stands her heart rate may be in the 130s, when she sits down is often around the 100, when she lays down about 70-80. Despite this she never has lightheadedness, dizziness and has not experienced syncope. She also denies exertional dyspnea. She has significant hypertriglyceridemia but after starting fenofibrate the reduction in triglycerides was excellent.    Past Medical History  Diagnosis Date  . Hyperlipidemia   . Diabetes mellitus   . Mitral valve prolapse   . POTS (postural orthostatic tachycardia syndrome)   . Hyperlipidemia     Past Surgical History  Procedure Laterality Date  . Appendectomy    . Appendectomy    . Transthoracic echocardiogram  12/14/2009    LV SYSTOLIC FUNCTION NORMAL. EF =>55%. LEFT ATRIAL SIZE NORMAL. MITRAL VALVE LEAFLETS APPEAR THICKENED, BUT OPEN WELL. MILD MVP. PROLAPSE OF THE ANTERIOR MITRAL LEAFLET. PROLAPSE OF THE POSTERIOR MITRAL LEAFLETS. MILD MR. MILD BI-LEAFLET MVP WITHOUT SIGN MR.     Current Outpatient Prescriptions  Medication Sig Dispense Refill  . ALPRAZolam (XANAX) 0.5 MG tablet Take 0.5 mg by mouth as needed.    . fenofibrate 160 MG tablet Take 160 mg by mouth daily.    [name]. [name] 0.18/0.215/0.25 MG-35 MCG tablet daily.     No current facility-administered medications for this visit.    Allergies:   Review of patient's allergies  indicates no known allergies.    Social History:  The patient  reports that she has never smoked. She does not have any smokeless tobacco history on file. She reports that she does not drink alcohol or use illicit drugs.   Family History:  The patient's family history is negative  ROS:  Please see the history of present illness.    Otherwise, review of systems positive for none.   All other systems are reviewed and negative.    PHYSICAL EXAM: VS:  BP 130/86 mmHg  Pulse 96  Ht 5\' 2"  (1.575 m)  Wt 70.398 kg (155 lb 3.2 oz)  BMI 28.38 kg/m2 , BMI Body mass index is 28.38 kg/(m^2).  General: Alert, oriented x3, no distress Head: no evidence of trauma, PERRL, EOMI, no exophtalmos or lid lag, no myxedema, no xanthelasma; normal ears, nose and oropharynx Neck: normal jugular venous pulsations and no hepatojugular reflux; brisk carotid pulses without delay and no carotid bruits Chest: clear to auscultation, no signs of consolidation by percussion or palpation, normal fremitus, symmetrical and full respiratory excursions Cardiovascular: normal position and quality of the apical impulse, regular rhythm, normal first and second heart sounds, no murmurs, rubs or gallops. There is a fairly distinct midsystolic click, but no murmur even with the Valsalva maneuver Abdomen: no tenderness or distention, no masses by palpation, no abnormal pulsatility or arterial bruits, normal bowel sounds, no hepatosplenomegaly Extremities: no clubbing, cyanosis or edema; 2+ radial, ulnar and brachial pulses bilaterally;  2+ right femoral, posterior tibial and dorsalis pedis pulses; 2+ left femoral, posterior tibial and dorsalis pedis pulses; no subclavian or femoral bruits Neurological: grossly nonfocal Psych: euthymic mood, full affect   EKG:  EKG is ordered today. The ekg ordered today demonstrates mild sinus tachycardia, mild prolonged QT interval   Recent Labs: No results found for requested labs within last  365 days.    Lipid Panel No results found for: CHOL, TRIG, HDL, CHOLHDL, VLDL, LDLCALC, LDLDIRECT    Wt Readings from Last 3 Encounters:  11/03/14 70.398 kg (155 lb 3.2 oz)  08/19/13 67.631 kg (149 lb 1.6 oz)  12/07/11 78.019 kg (172 lb)      ASSESSMENT AND PLAN:  1.  Brittany Sampson is still tachycardic with orthostasis, but is tolerating the tachycardia without any symptoms of dizziness, lightheadedness or any other adverse effects. Reminded her about the importance of aggressive hydration and a relatively high sodium diet. As much is possible she should avoid prolonged standing without moving.  2. Brittany Sampson has mitral valve prolapse without any significant  3. QT interval prolongation is noted. I'm not sure if it could be related to exogenous estrogen in her oral contraceptive. She is not on any medications that could cause QT prolongation or major electrolyte imbalances. We'll monitor for now. There is no personal or family history of syncope or sudden death.  4. Hypertriglyceridemia with excellent reduction in triglyceride levels on fenofibrate.   Current medicines are reviewed at length with the patient today.  The patient does not have concerns regarding medicines.  The following changes have been made:  no change  Labs/ tests ordered today include:  Orders Placed This Encounter  Procedures  . EKG 12-Lead    Patient Instructions  Dr. Royann Shiversroitoru recommends that you schedule a follow-up appointment in: One Year.      Joie BimlerSigned, Thatcher Doberstein, MD  11/03/2014 4:51 PM    Thurmon FairMihai Barett Whidbee, MD, [facility] [phone] office [phone] pager

## 2014-11-03 NOTE — Patient Instructions (Signed)
Dr. Croitoru recommends that you schedule a follow-up appointment in: One Year.   

## 2014-11-07 ENCOUNTER — Encounter: Payer: Self-pay | Admitting: Cardiovascular Disease

## 2014-11-09 ENCOUNTER — Telehealth: Payer: Self-pay | Admitting: Cardiovascular Disease

## 2014-11-09 MED ORDER — FENOFIBRATE 160 MG PO TABS: 160.0000 mg | ORAL_TABLET | Freq: Every day | ORAL | Status: DC

## 2014-11-09 NOTE — Telephone Encounter (Signed)
Rx refilled.

## 2014-11-09 NOTE — Telephone Encounter (Signed)
°  1. Which medications need to be refilled? Fenofibrate  2. Which pharmacy is medication to be sent to? [facility] [phone]  3. Do they need a 30 day or 90 day supply? 30 and refills  4. Would they like a call back once the medication has been sent to the pharmacy? no

## 2015-01-04 ENCOUNTER — Other Ambulatory Visit: Payer: Self-pay | Admitting: *Deleted

## 2015-01-04 MED ORDER — ALPRAZOLAM 0.5 MG PO TABS: 0.5000 mg | ORAL_TABLET | ORAL | Status: DC | PRN

## 2015-01-23 ENCOUNTER — Telehealth: Payer: Self-pay | Admitting: Cardiovascular Disease

## 2015-01-23 NOTE — Telephone Encounter (Signed)
Message sent to [facility] Dr.Croitoru's cma.

## 2015-01-23 NOTE — Telephone Encounter (Signed)
Brittany Sampson left several messages about the refill Rx request we received from patient's pharmacy for Xanax.  Patient states her PCP has always prescribed and refilled this medication.  Dr. Salena Saner had already signed the Rx so it was shredded.  Patient states her PCP has already taken care of this.

## 2015-01-23 NOTE — Telephone Encounter (Signed)
Pt called in stating that she received 3 calls from our office within the past 2 days asking that she call back to the office but they did not leave a name or information on what the call was in regards to. She voiced her frustration and wanted to make sure that her statement was documented because she wanted the doctor to be aware that she was returning the call. Please call  Thanks

## 2015-11-11 ENCOUNTER — Other Ambulatory Visit: Payer: Self-pay | Admitting: Cardiovascular Disease

## 2015-11-13 NOTE — Telephone Encounter (Signed)
.  srx

## 2015-12-17 ENCOUNTER — Other Ambulatory Visit: Payer: Self-pay | Admitting: Cardiovascular Disease

## 2016-01-09 ENCOUNTER — Telehealth: Payer: Self-pay | Admitting: Cardiovascular Disease

## 2016-01-09 NOTE — Telephone Encounter (Signed)
New message       The OB wants to put pt on Celexa 10 mg daily, the pt wants to make sure the MD is OK   Pt c/o medication issue:  1. Name of Medication: celexa 2. How are you currently taking this medication (dosage and times per day)? 10 mg po daily  3. Are you having a reaction (difficulty breathing--STAT)? no  4. What is your medication issue? The OB wants the pt to be on the medication cause of the mood swings and the pt wants to make sure it's ok with Dr. Salena Saner

## 2016-01-09 NOTE — Telephone Encounter (Signed)
Ob GYN requesting Dr. Erin Hearing advice on whether OK for patient to start Celexa.  Will route for recommendations.

## 2016-01-10 NOTE — Telephone Encounter (Signed)
No answer. Left message to call back.   

## 2016-01-10 NOTE — Telephone Encounter (Signed)
Patient returned call. Gave her Dr [employer] advice. She verbalized understanding.

## 2016-01-10 NOTE — Telephone Encounter (Signed)
Should not be a problem MCr 

## 2016-01-25 ENCOUNTER — Encounter (INDEPENDENT_AMBULATORY_CARE_PROVIDER_SITE_OTHER): Payer: Self-pay

## 2016-01-25 ENCOUNTER — Encounter: Payer: Self-pay | Admitting: Cardiovascular Disease

## 2016-01-25 ENCOUNTER — Ambulatory Visit (INDEPENDENT_AMBULATORY_CARE_PROVIDER_SITE_OTHER): Payer: 59 | Admitting: Cardiovascular Disease

## 2016-01-25 VITALS — BP 132/86 | HR 76 | Ht 62.0 in | Wt 164.8 lb

## 2016-01-25 DIAGNOSIS — I951 Orthostatic hypotension: Secondary | ICD-10-CM | POA: Diagnosis not present

## 2016-01-25 DIAGNOSIS — R9431 Abnormal electrocardiogram [ECG] [EKG]: Secondary | ICD-10-CM

## 2016-01-25 DIAGNOSIS — E781 Pure hyperglyceridemia: Secondary | ICD-10-CM

## 2016-01-25 DIAGNOSIS — I341 Nonrheumatic mitral (valve) prolapse: Secondary | ICD-10-CM

## 2016-01-25 DIAGNOSIS — R000 Tachycardia, unspecified: Secondary | ICD-10-CM | POA: Diagnosis not present

## 2016-01-25 DIAGNOSIS — I4581 Long QT syndrome: Secondary | ICD-10-CM

## 2016-01-25 DIAGNOSIS — G90A Postural orthostatic tachycardia syndrome (POTS): Secondary | ICD-10-CM

## 2016-01-25 MED ORDER — FENOFIBRATE 160 MG PO TABS: 160.0000 mg | ORAL_TABLET | Freq: Every day | ORAL | 11 refills | Status: DC

## 2016-01-25 NOTE — Patient Instructions (Signed)
Dr Croitoru recommends that you schedule a follow-up appointment in 1 year. You will receive a reminder letter in the mail two months in advance. If you don't receive a letter, please call our office to schedule the follow-up appointment.  If you need a refill on your cardiac medications before your next appointment, please call your pharmacy. 

## 2016-01-25 NOTE — Progress Notes (Signed)
Cardiology Office Note    Date:  01/25/2016   ID:  Brittany HolmesBrandy Sampson, DOB 03/13/1981, MRN [id number]  PCP:  Doreatha MartinVELAZQUEZ,GRETCHEN, MD  Cardiologist:   Thurmon FairMihai Quinetta Shilling, MD   chief complaint: Tachycardia, paresthesia.   History of Present Illness:  Brittany Sampson is a 35 y.o. female with asymptomatic mitral valve prolapse without mitral regurgitation, well controlled postural orthostatic tachycardia syndrome, treated hypertriglyceridemia and borderline prolonged QT interval, here for routine follow-up.  She is generally doing quite well and denies any cardiovascular symptoms. She does not have syncope, dizziness, palpitations, shortness of breath or angina with exertion, edema, claudication, focal neurological complaints. Is complaining of paresthesias in her feet that have remarkable correlation with her menstrual period. Also is having some increased anxiety.    Past Medical History:  Diagnosis Date  . Diabetes mellitus   . Hyperlipidemia   . Hyperlipidemia   . Mitral valve prolapse   . POTS (postural orthostatic tachycardia syndrome)     Past Surgical History:  Procedure Laterality Date  . APPENDECTOMY    . APPENDECTOMY    . TRANSTHORACIC ECHOCARDIOGRAM  12/14/2009   LV SYSTOLIC FUNCTION NORMAL. EF =>55%. LEFT ATRIAL SIZE NORMAL. MITRAL VALVE LEAFLETS APPEAR THICKENED, BUT OPEN WELL. MILD MVP. PROLAPSE OF THE ANTERIOR MITRAL LEAFLET. PROLAPSE OF THE POSTERIOR MITRAL LEAFLETS. MILD MR. MILD BI-LEAFLET MVP WITHOUT SIGN MR.    Current Medications: Outpatient Medications Prior to Visit  Medication Sig Dispense Refill  . fenofibrate 160 MG tablet TAKE 1 CAPSULE BY MOUTH EVERY DAY - PATIENT NEEDS TO SCHEDULE AN APPOINTMENT BEFORE ANYMORE REFILLS 30 tablet 1  . ALPRAZolam (XANAX) 0.5 MG tablet Take 1 tablet (0.5 mg total) by mouth as needed. (Patient not taking: Reported on 01/25/2016) 30 tablet 4  . [name] 0.18/0.215/0.25 MG-35 MCG tablet daily.     No facility-administered  medications prior to visit.      Allergies:   Review of patient's allergies indicates no known allergies.   Social History   Social History  . Marital status: Married    Spouse name: N/A  . Number of children: N/A  . Years of education: N/A   Social History Main Topics  . Smoking status: Former Games developermoker  . Smokeless tobacco: Never Used  . Alcohol use No  . Drug use: No  . Sexual activity: Yes   Other Topics Concern  . None   Social History Narrative  . None     Family History:  The patient's family history includes Brain cancer in her brother; Diabetes Mellitus I in her brother; Healthy in her father; Heart attack in her brother; Heart disease in her brother; Hyperlipidemia in her mother; Hypertension in her mother; Melanoma in her brother.   ROS:   Please see the history of present illness.    ROS All other systems reviewed and are negative.   PHYSICAL EXAM:   VS:  BP 132/86   Pulse 76   Ht 5\' 2"  (1.575 m)   Wt 164 lb 12.8 oz (74.8 kg)   BMI 30.14 kg/m    GEN: Well nourished, well developed, in no acute distress  HEENT: normal  Neck: no JVD, carotid bruits, or masses Cardiac: RRR; no murmurs, rubs, or gallops,no edema . There is a faint apical late systolic click, but no murmur even with the Valsalva maneuver Respiratory:  clear to auscultation bilaterally, normal work of breathing GI: soft, nontender, nondistended, + BS MS: no deformity or atrophy  Skin: warm and dry, no rash Neuro:  Alert and Oriented x 3, Strength and sensation are intact Psych: euthymic mood, full affect  Wt Readings from Last 3 Encounters:  01/25/16 164 lb 12.8 oz (74.8 kg)  11/03/14 155 lb 3.2 oz (70.4 kg)  08/19/13 149 lb 1.6 oz (67.6 kg)      Studies/Labs Reviewed:   EKG:  EKG is ordered today.  The ekg ordered today demonstrates Normal sinus rhythm, nonspecific T-wave changes, borderline QTC 459 ms  Recent Labs: Hemoglobin 13.3, borderline high platelets 405, creatinine 0.6,  normal glucose  Lipid Panel Total cholesterol 165, HDL 52, LDL 92, triglycerides 105   ASSESSMENT:    1. POTS (postural orthostatic tachycardia syndrome)   2. Mitral valve prolapse   3. Hypertriglyceridemia   4. Prolonged Q-T interval on ECG      PLAN:  In order of problems listed above:  1. POTS: Essentially asymptomatic with simple lifestyle changes 2. MVP: No audible murmur, asymptomatic 3. HLP: Excellent triglyceride level on fenofibrate, continue same medication 4. LQT: Borderline prolongation on current electrocardiogram. No family history of sudden death, no personal history of syncope. Probably wise to avoid QT prolonging agents.    Medication Adjustments/Labs and Tests Ordered: Current medicines are reviewed at length with the patient today.  Concerns regarding medicines are outlined above.  Medication changes, Labs and Tests ordered today are listed in the Patient Instructions below. There are no Patient Instructions on file for this visit.   Signed, Thurmon Fair, MD  01/25/2016 9:52 AM    [facility] [address] [place], [place], [place]  [address] Phone: [phone]; Fax: [phone]

## 2016-02-19 ENCOUNTER — Other Ambulatory Visit: Payer: Self-pay | Admitting: *Deleted

## 2016-02-19 MED ORDER — FENOFIBRATE 160 MG PO TABS: 160.0000 mg | ORAL_TABLET | Freq: Every day | ORAL | 3 refills | Status: DC

## 2017-01-30 ENCOUNTER — Other Ambulatory Visit: Payer: Self-pay | Admitting: Cardiovascular Disease

## 2017-01-30 ENCOUNTER — Ambulatory Visit (INDEPENDENT_AMBULATORY_CARE_PROVIDER_SITE_OTHER): Payer: 59 | Admitting: Cardiovascular Disease

## 2017-01-30 ENCOUNTER — Encounter: Payer: Self-pay | Admitting: Cardiovascular Disease

## 2017-01-30 VITALS — BP 114/80 | HR 73 | Ht 62.0 in | Wt 169.0 lb

## 2017-01-30 DIAGNOSIS — R000 Tachycardia, unspecified: Secondary | ICD-10-CM | POA: Diagnosis not present

## 2017-01-30 DIAGNOSIS — I341 Nonrheumatic mitral (valve) prolapse: Secondary | ICD-10-CM

## 2017-01-30 DIAGNOSIS — G90A Postural orthostatic tachycardia syndrome (POTS): Secondary | ICD-10-CM

## 2017-01-30 DIAGNOSIS — E781 Pure hyperglyceridemia: Secondary | ICD-10-CM

## 2017-01-30 DIAGNOSIS — I951 Orthostatic hypotension: Secondary | ICD-10-CM

## 2017-01-30 MED ORDER — FENOFIBRATE 54 MG PO TABS: 54.0000 mg | ORAL_TABLET | Freq: Every day | ORAL | 11 refills | Status: DC

## 2017-01-30 NOTE — Progress Notes (Signed)
Cardiology Office Note    Date:  01/30/2017   ID:  Danitra Swarey, DOB 10/19/1980, MRN [id number]  PCP:  Cheron Schaumann., MD  Cardiologist:   Thurmon Fair, MD   chief complaint: Follow-up hyperlipidemia and POTS   History of Present Illness:  Kentoria Perry is a 36 y.o. female with asymptomatic mitral valve prolapse without mitral regurgitation, well controlled postural orthostatic tachycardia syndrome, treated hypertriglyceridemia and borderline prolonged QT interval, here for routine follow-up.  POTS symptoms have improved substantially after she started taking citalopram for anxiety. She is no longer troubled by palpitations. She has excellent triglyceride levels on the current dose of fenofibrate. Previous attempts at discontinuing this medication led to triglyceride levels in the 250 range. QTc interval on today's echocardiogram is normal at 445 ms.  The patient specifically denies any chest pain at rest exertion, dyspnea at rest or with exertion, orthopnea, paroxysmal nocturnal dyspnea, syncope, palpitations, focal neurological deficits, intermittent claudication, lower extremity edema, unexplained weight gain, cough, hemoptysis or wheezing.    Past Medical History:  Diagnosis Date  . Diabetes mellitus   . Hyperlipidemia   . Hyperlipidemia   . Mitral valve prolapse   . POTS (postural orthostatic tachycardia syndrome)     Past Surgical History:  Procedure Laterality Date  . APPENDECTOMY    . APPENDECTOMY    . TRANSTHORACIC ECHOCARDIOGRAM  12/14/2009   LV SYSTOLIC FUNCTION NORMAL. EF =>55%. LEFT ATRIAL SIZE NORMAL. MITRAL VALVE LEAFLETS APPEAR THICKENED, BUT OPEN WELL. MILD MVP. PROLAPSE OF THE ANTERIOR MITRAL LEAFLET. PROLAPSE OF THE POSTERIOR MITRAL LEAFLETS. MILD MR. MILD BI-LEAFLET MVP WITHOUT SIGN MR.    Current Medications: Outpatient Medications Prior to Visit  Medication Sig Dispense Refill  . ALPRAZolam (XANAX) 0.5 MG tablet Take 0.5 mg by mouth  as needed for anxiety.    . cetirizine (ZYRTEC) 10 MG tablet Take 10 mg by mouth daily.    . citalopram (CELEXA) 10 MG tablet Take 10 mg by mouth daily.    . fenofibrate 160 MG tablet Take 1 tablet (160 mg total) by mouth daily. 90 tablet 3   No facility-administered medications prior to visit.      Allergies:   Patient has no known allergies.   Social History   Social History  . Marital status: Married    Spouse name: N/A  . Number of children: N/A  . Years of education: N/A   Social History Main Topics  . Smoking status: Former Games developer  . Smokeless tobacco: Never Used  . Alcohol use No  . Drug use: No  . Sexual activity: Yes   Other Topics Concern  . None   Social History Narrative  . None     Family History:  The patient's family history includes Brain cancer in her brother; Diabetes Mellitus I in her brother; Healthy in her father; Heart attack in her brother; Heart disease in her brother; Hyperlipidemia in her mother; Hypertension in her mother; Melanoma in her brother.   ROS:   Please see the history of present illness.    ROS All other systems reviewed and are negative.   PHYSICAL EXAM:   VS:  BP 114/80   Pulse 73   Ht 5\' 2"  (1.575 m)   Wt 169 lb (76.7 kg)   BMI 30.91 kg/m    GEN: Well nourished, well developed, in no acute distress  General: Alert, oriented x3, no distress Head: no evidence of trauma, PERRL, EOMI, no exophtalmos or lid lag, no myxedema, no  xanthelasma; normal ears, nose and oropharynx Neck: normal jugular venous pulsations and no hepatojugular reflux; brisk carotid pulses without delay and no carotid bruits Chest: clear to auscultation, no signs of consolidation by percussion or palpation, normal fremitus, symmetrical and full respiratory excursions Cardiovascular: normal position and quality of the apical impulse, regular rhythm, normal first and second heart sounds, no murmurs, rubs or gallops.She has a faint late systolic click that seems  to intensify following the Valsalva maneuver, but there is no murmur at rest or with provocative maneuvers  Abdomen: no tenderness or distention, no masses by palpation, no abnormal pulsatility or arterial bruits, normal bowel sounds, no hepatosplenomegaly Extremities: no clubbing, cyanosis or edema; 2+ radial, ulnar and brachial pulses bilaterally; 2+ right femoral, posterior tibial and dorsalis pedis pulses; 2+ left femoral, posterior tibial and dorsalis pedis pulses; no subclavian or femoral bruits Neurological: grossly nonfocal Psych: euthymic mood, full affect  Wt Readings from Last 3 Encounters:  01/30/17 169 lb (76.7 kg)  01/25/16 164 lb 12.8 oz (74.8 kg)  11/03/14 155 lb 3.2 oz (70.4 kg)      Studies/Labs Reviewed:   EKG:  EKG is ordered today.  It shows normal sinus rhythm, nonspecific lateral T-wave flattening and a QTC of 445 ms Recent Labs: Hemoglobin 13.3, borderline high platelets 405, creatinine 0.6, normal glucose  Lipid Panel 10/23/2016 total cholesterol 193, triglycerides 82, HDL 52, calculated LDL 125.   ASSESSMENT:    1. POTS (postural orthostatic tachycardia syndrome)   2. Mitral valve prolapse   3. Hypertriglyceridemia      PLAN:  In order of problems listed above:  1. POTS: Asymptomatic with lifestyle changes and SSRI. 2. MVP: Asymptomatic, no murmur by exam. 3. HLP: Excellent triglyceride level on fenofibrate, we'll try to see if the lower dose of medication provide sufficient triglyceride level control. Reduced to 54 mg daily 4. LQT: Borderline prolongation on previous electrocardiogram, normal today. No family history of sudden death, no personal history of syncope. Probably wise to avoid QT prolonging agents.    Medication Adjustments/Labs and Tests Ordered: Current medicines are reviewed at length with the patient today.  Concerns regarding medicines are outlined above.  Medication changes, Labs and Tests ordered today are listed in the Patient  Instructions below. Patient Instructions  Dr Royann Shivers has recommended making the following medication changes: 1. DECREASE Fenofibrate to 54 mg daily  Your physician recommends that you return for lab work in 6 months - FASTING.  Dr Royann Shivers recommends that you schedule a follow-up appointment in 12 months. You will receive a reminder letter in the mail two months in advance. If you don't receive a letter, please call our office to schedule the follow-up appointment.  If you need a refill on your cardiac medications before your next appointment, please call your pharmacy.    Signed, Thurmon Fair, MD  01/30/2017 10:26 AM    [facility] [address] [place], [place], [place]  [address] Phone: [phone]; Fax: [phone]

## 2017-01-30 NOTE — Patient Instructions (Signed)
Dr Royann Shivers has recommended making the following medication changes: 1. DECREASE Fenofibrate to 54 mg daily  Your physician recommends that you return for lab work in 6 months - FASTING.  Dr Royann Shivers recommends that you schedule a follow-up appointment in 12 months. You will receive a reminder letter in the mail two months in advance. If you don't receive a letter, please call our office to schedule the follow-up appointment.  If you need a refill on your cardiac medications before your next appointment, please call your pharmacy.

## 2018-04-16 ENCOUNTER — Other Ambulatory Visit: Payer: Self-pay | Admitting: Cardiovascular Disease

## 2018-05-20 ENCOUNTER — Ambulatory Visit: Payer: 59 | Admitting: Cardiovascular Disease

## 2018-06-25 ENCOUNTER — Ambulatory Visit: Payer: Self-pay | Admitting: Cardiovascular Disease

## 2018-08-06 ENCOUNTER — Ambulatory Visit: Payer: Self-pay | Admitting: Cardiovascular Disease

## 2018-11-10 ENCOUNTER — Telehealth: Payer: Self-pay | Admitting: Physician Assistant

## 2018-11-11 NOTE — Telephone Encounter (Signed)
New Message ° ° °Patient returning your call for pre reg. °

## 2018-11-11 NOTE — Telephone Encounter (Signed)
Called x3 for pre reg/LVM °

## 2018-11-12 ENCOUNTER — Telehealth (INDEPENDENT_AMBULATORY_CARE_PROVIDER_SITE_OTHER): Payer: BLUE CROSS/BLUE SHIELD | Admitting: Physician Assistant

## 2018-11-12 ENCOUNTER — Encounter: Payer: Self-pay | Admitting: Physician Assistant

## 2018-11-12 ENCOUNTER — Telehealth: Payer: Self-pay

## 2018-11-12 VITALS — Ht 62.0 in | Wt 175.0 lb

## 2018-11-12 DIAGNOSIS — G90A Postural orthostatic tachycardia syndrome (POTS): Secondary | ICD-10-CM

## 2018-11-12 DIAGNOSIS — R000 Tachycardia, unspecified: Secondary | ICD-10-CM | POA: Diagnosis not present

## 2018-11-12 DIAGNOSIS — E781 Pure hyperglyceridemia: Secondary | ICD-10-CM

## 2018-11-12 DIAGNOSIS — I951 Orthostatic hypotension: Secondary | ICD-10-CM | POA: Diagnosis not present

## 2018-11-12 DIAGNOSIS — R9431 Abnormal electrocardiogram [ECG] [EKG]: Secondary | ICD-10-CM

## 2018-11-12 DIAGNOSIS — I341 Nonrheumatic mitral (valve) prolapse: Secondary | ICD-10-CM

## 2018-11-12 NOTE — Telephone Encounter (Signed)
Virtual Visit Pre-Appointment Phone Call  "(Name), I am calling you today to discuss your upcoming appointment. We are currently trying to limit exposure to the virus that causes COVID-19 by seeing patients at home rather than in the office."  1. "What is the BEST phone number to call the day of the visit?" - include this in appointment notes  2. "Do you have or have access to (through a family member/friend) a smartphone with video capability that we can use for your visit?" a. If yes - list this number in appt notes as "cell" (if different from BEST phone #) and list the appointment type as a VIDEO visit in appointment notes b. If no - list the appointment type as a PHONE visit in appointment notes  3. Confirm consent - "In the setting of the current Covid19 crisis, you are scheduled for a (phone or video) visit with your provider on (date) at (time).  Just as we do with many in-office visits, in order for you to participate in this visit, we must obtain consent.  If you'd like, I can send this to your mychart (if signed up) or email for you to review.  Otherwise, I can obtain your verbal consent now.  All virtual visits are billed to your insurance company just like a normal visit would be.  By agreeing to a virtual visit, we'd like you to understand that the technology does not allow for your provider to perform an examination, and thus may limit your provider's ability to fully assess your condition. If your provider identifies any concerns that need to be evaluated in person, we will make arrangements to do so.  Finally, though the technology is pretty good, we cannot assure that it will always work on either your or our end, and in the setting of a video visit, we may have to convert it to a phone-only visit.  In either situation, we cannot ensure that we have a secure connection.  Are you willing to proceed?" STAFF: Did the patient verbally acknowledge consent to telehealth visit? Document  YES/NO here: YES  4. Advise patient to be prepared - "Two hours prior to your appointment, go ahead and check your blood pressure, pulse, oxygen saturation, and your weight (if you have the equipment to check those) and write them all down. When your visit starts, your provider will ask you for this information. If you have an Apple Watch or Kardia device, please plan to have heart rate information ready on the day of your appointment. Please have a pen and paper handy nearby the day of the visit as well."  5. Give patient instructions for MyChart download to smartphone OR Doximity/Doxy.me as below if video visit (depending on what platform provider is using)  6. Inform patient they will receive a phone call 15 minutes prior to their appointment time (may be from unknown caller ID) so they should be prepared to answer    TELEPHONE CALL NOTE  Brittany Sampson has been deemed a candidate for a follow-up tele-health visit to limit community exposure during the Covid-19 pandemic. I spoke with the patient via phone to ensure availability of phone/video source, confirm preferred email & phone number, and discuss instructions and expectations.  I reminded Brittany Sampson to be prepared with any vital sign and/or heart rhythm information that could potentially be obtained via home monitoring, at the time of her visit. I reminded Brittany Sampson to expect a phone call prior to her visit.  Dorris Fetch, CMA 11/12/2018 10:10 AM   INSTRUCTIONS FOR DOWNLOADING THE MYCHART APP TO SMARTPHONE  - The patient must first make sure to have activated MyChart and know their login information - If Apple, go to [employer] and type in MyChart in the search bar and download the app. If Android, ask patient to go to [employer] and type in [place] in the search bar and download the app. The app is free but as with any other app downloads, their phone may require them to verify saved payment information or  Apple/Android password.  - The patient will need to then log into the app with their MyChart username and password, and select [facility] as their healthcare provider to link the account. When it is time for your visit, go to the MyChart app, find appointments, and click Begin Video Visit. Be sure to Select Allow for your device to access the Microphone and Camera for your visit. You will then be connected, and your provider will be with you shortly.  **If they have any issues connecting, or need assistance please contact MyChart service desk [phone])**  **If using a computer, in order to ensure the best quality for their visit they will need to use either of the following Internet Browsers: [employer] or Google Chrome**  IF USING DOXIMITY or DOXY.ME - The patient will receive a link just prior to their visit by text.     FULL LENGTH CONSENT FOR TELE-HEALTH VISIT   I hereby voluntarily request, consent and authorize [facility] and its employed or contracted physicians, physician assistants, nurse practitioners or other licensed health care professionals (the Practitioner), to provide me with telemedicine health care services (the "Services") as deemed necessary by the treating Practitioner. I acknowledge and consent to receive the Services by the Practitioner via telemedicine. I understand that the telemedicine visit will involve communicating with the Practitioner through live audiovisual communication technology and the disclosure of certain medical information by electronic transmission. I acknowledge that I have been given the opportunity to request an in-person assessment or other available alternative prior to the telemedicine visit and am voluntarily participating in the telemedicine visit.  I understand that I have the right to withhold or withdraw my consent to the use of telemedicine in the course of my care at any time, without affecting my right to future care  or treatment, and that the Practitioner or I may terminate the telemedicine visit at any time. I understand that I have the right to inspect all information obtained and/or recorded in the course of the telemedicine visit and may receive copies of available information for a reasonable fee.  I understand that some of the potential risks of receiving the Services via telemedicine include:  Marland Kitchen Delay or interruption in medical evaluation due to technological equipment failure or disruption; . Information transmitted may not be sufficient (e.g. poor resolution of images) to allow for appropriate medical decision making by the Practitioner; and/or  . In rare instances, security protocols could fail, causing a breach of personal health information.  Furthermore, I acknowledge that it is my responsibility to provide information about my medical history, conditions and care that is complete and accurate to the best of my ability. I acknowledge that Practitioner's advice, recommendations, and/or decision may be based on factors not within their control, such as incomplete or inaccurate data provided by me or distortions of diagnostic images or specimens that may result from electronic transmissions. I understand that the  practice of medicine is not an Chief Strategy Officer and that Practitioner makes no warranties or guarantees regarding treatment outcomes. I acknowledge that I will receive a copy of this consent concurrently upon execution via email to the email address I last provided but may also request a printed copy by calling the office of [facility].    I understand that my insurance will be billed for this visit.   I have read or had this consent read to me. . I understand the contents of this consent, which adequately explains the benefits and risks of the Services being provided via telemedicine.  . I have been provided ample opportunity to ask questions regarding this consent and the Services and have had  my questions answered to my satisfaction. . I give my informed consent for the services to be provided through the use of telemedicine in my medical care  By participating in this telemedicine visit I agree to the above.

## 2018-11-12 NOTE — Patient Instructions (Addendum)
Medication Instructions:   Your physician recommends that you continue on your current medications as directed. Please refer to the Current Medication list given to you today.  If you need a refill on your cardiac medications before your next appointment, please call your pharmacy.   Lab work:  NONE ordered at this time of appointment. Defer to PCP at annual visit  If you have labs (blood work) drawn today and your tests are completely normal, you will receive your results only by: Marland Kitchen MyChart Message (if you have MyChart) OR . A paper copy in the mail If you have any lab test that is abnormal or we need to change your treatment, we will call you to review the results.  Testing/Procedures:  NONE ordered at this time of appointment .  Follow-Up:   Your physician recommends that you schedule a Nurse visit appointment in 3 months  At [facility], you and your health needs are our priority.  As part of our continuing mission to provide you with exceptional heart care, we have created designated Provider Care Teams.  These Care Teams include your primary Cardiologist (physician) and Advanced Practice Providers (APPs -  Physician Assistants and Nurse Practitioners) who all work together to provide you with the care you need, when you need it. You will need a follow up appointment in 12 months (May 2021).  Please call our office in February/March 2021 to schedule this appointment.  You may see Thurmon Fair, MD or one of the following Advanced Practice Providers on your designated Care Team: [place], [place] . Micah Flesher, PA-C  Any Other Special Instructions Will Be Listed Below (If Applicable).

## 2018-11-12 NOTE — Progress Notes (Signed)
Virtual Visit via Telephone Note   This visit type was conducted due to national recommendations for restrictions regarding the COVID-19 Pandemic (e.g. social distancing) in an effort to limit this patient's exposure and mitigate transmission in our community.  Due to her co-morbid illnesses, this patient is at least at moderate risk for complications without adequate follow up.  This format is felt to be most appropriate for this patient at this time.  The patient did not have access to video technology/had technical difficulties with video requiring transitioning to audio format only (telephone).  All issues noted in this document were discussed and addressed.  No physical exam could be performed with this format.  Please refer to the patient's chart for her  consent to telehealth for [facility]   Date:  11/12/2018   ID:  Brittany Sampson, DOB 10-Dec-1980, MRN [id number]  Patient Location: Home Provider Location: Home  PCP:  Cheron Schaumann., MD  Cardiologist:  Thurmon Fair, MD  Electrophysiologist:  None   Evaluation Performed:  Follow-Up Visit  Chief Complaint:  followup  History of Present Illness:    Brittany Sampson is a 38 y.o. female with PMH of mitral valve prolapse, controlled postural orthostatic tachycardia syndrome, hypertriglyceridemia and borderline prolonged QT.  POTS syndrome has improved after she started taking citalopram for anxiety.  She does not have any palpitation.  Her triglyceride is been controlled on fenofibrate.   Patient was contacted today via telephone visit.  She currently works in a [employer] office.  She denies any recent chest discomfort or shortness of breath with exertion.  She has no dizziness upon standing.  Overall, I think she is doing quite well from cardiology perspective.  Last lipid panel obtained in May 2019 showed borderline elevated total cholesterol, however well controlled triglyceride.  I will defer repeat lab work  to her PCP.  I plan to bring her back in 78-month for nursing visit to obtain EKG otherwise she can follow-up in 1 year.  The patient does not have symptoms concerning for COVID-19 infection (fever, chills, cough, or new shortness of breath).    Past Medical History:  Diagnosis Date   Diabetes mellitus    Hyperlipidemia    Hyperlipidemia    Mitral valve prolapse    POTS (postural orthostatic tachycardia syndrome)    Past Surgical History:  Procedure Laterality Date   APPENDECTOMY     APPENDECTOMY     TRANSTHORACIC ECHOCARDIOGRAM  12/14/2009   LV SYSTOLIC FUNCTION NORMAL. EF =>55%. LEFT ATRIAL SIZE NORMAL. MITRAL VALVE LEAFLETS APPEAR THICKENED, BUT OPEN WELL. MILD MVP. PROLAPSE OF THE ANTERIOR MITRAL LEAFLET. PROLAPSE OF THE POSTERIOR MITRAL LEAFLETS. MILD MR. MILD BI-LEAFLET MVP WITHOUT SIGN MR.     Current Meds  Medication Sig   cetirizine (ZYRTEC) 10 MG tablet Take 10 mg by mouth daily.   citalopram (CELEXA) 10 MG tablet Take 10 mg by mouth daily.   fenofibrate 54 MG tablet TAKE ONE TABLET BY MOUTH DAILY     Allergies:   Patient has no known allergies.   Social History   Tobacco Use   Smoking status: Former Smoker   Smokeless tobacco: Never Used  Substance Use Topics   Alcohol use: No   Drug use: No     Family Hx: The patient's family history includes Brain cancer in her brother; Diabetes Mellitus I in her brother; Healthy in her father; Heart attack in her brother; Heart disease in her brother; Hyperlipidemia in her mother; Hypertension in her  mother; Melanoma in her brother. There is no history of Other.  ROS:   Please see the history of present illness.     All other systems reviewed and are negative.   Prior CV studies:   The following studies were reviewed today:  Echo 12/14/2009    Labs/Other Tests and Data Reviewed:    EKG:  An ECG dated 01/30/2017 was personally reviewed today and demonstrated:  Normal sinus rhythm with nonspecific T wave  changes  Recent Labs: No results found for requested labs within last 8760 hours.   Recent Lipid Panel No results found for: CHOL, TRIG, HDL, CHOLHDL, LDLCALC, LDLDIRECT  Wt Readings from Last 3 Encounters:  11/12/18 175 lb (79.4 kg)  01/30/17 169 lb (76.7 kg)  01/25/16 164 lb 12.8 oz (74.8 kg)     Objective:    Vital Signs:  Ht 5\' 2"  (1.575 m)    Wt 175 lb (79.4 kg)    BMI 32.01 kg/m    VITAL SIGNS:  reviewed  ASSESSMENT & PLAN:    1. Postural orthostatic tachycardia syndrome: Denies any dizziness upon standing.  Symptoms seems to be quite well controlled.  2. Mitral valve prolapse: Last echocardiogram obtained in July 2011 showed very mild mitral valve prolapse.  She denies any shortness of breath with exertion or chest discomfort  3. Hypertriglyceridemia: Continue fenofibrate.  Lipid panel followed by primary care provider  4. Borderline prolonged QT: Obtain EKG in office in 3 months via nursing visit   COVID-19 Education: The signs and symptoms of COVID-19 were discussed with the patient and how to seek care for testing (follow up with PCP or arrange E-visit).  The importance of social distancing was discussed today.  Time:   Today, I have spent 10 minutes with the patient with telehealth technology discussing the above problems.     Medication Adjustments/Labs and Tests Ordered: Current medicines are reviewed at length with the patient today.  Concerns regarding medicines are outlined above.   Tests Ordered: No orders of the defined types were placed in this encounter.   Medication Changes: No orders of the defined types were placed in this encounter.   Disposition:  Follow up in 1 year(s)  Signed, [name], PA  11/12/2018 8:59 AM    [facility]

## 2019-01-21 ENCOUNTER — Ambulatory Visit: Payer: BLUE CROSS/BLUE SHIELD | Admitting: Cardiovascular Disease

## 2019-04-08 ENCOUNTER — Ambulatory Visit: Payer: BLUE CROSS/BLUE SHIELD | Admitting: Cardiovascular Disease

## 2019-05-16 ENCOUNTER — Other Ambulatory Visit: Payer: Self-pay | Admitting: Cardiovascular Disease

## 2019-05-27 ENCOUNTER — Telehealth: Payer: Self-pay | Admitting: Cardiovascular Disease

## 2019-05-27 NOTE — Telephone Encounter (Signed)
    Patient called upset, screaming because her 12/16 appointment had been changed to virtual, without someone calling her to inform her. Patient states she was told she would be getting an EKG.  Scheduler advised patient provider would have a virtual clinic only that day. Appointment rescheduled with APP for later date. Patient is requesting she be contacted with an additional explanation.

## 2019-05-27 NOTE — Telephone Encounter (Signed)
Returned the call to the patient to apologize for the inconvienence and to offer her an in office appointment with Dr. Sallyanne Kuster. She stated that she had an appointment with a PA and was fine with that. She was very understanding of the situation and stated she would keep her appointment with Almyra Deforest, PA.

## 2019-05-27 NOTE — Telephone Encounter (Signed)
Returned call to pt she states that she is very upset, she states that whomever scheduled her appointment should have known that this  Was a virtual day she took the day off and "everythig" she states that she needs to have an EKG she has already had a virtual visit and does not want another appt has been rescheduled.. she states that she would like DR c to know.  I have apologized to pt and will forward message. Unsure if she needs a CB she was too upset

## 2019-06-01 ENCOUNTER — Telehealth: Payer: Self-pay | Admitting: Cardiovascular Disease

## 2019-06-06 ENCOUNTER — Encounter: Payer: Self-pay | Admitting: Physician Assistant

## 2019-06-06 ENCOUNTER — Ambulatory Visit (INDEPENDENT_AMBULATORY_CARE_PROVIDER_SITE_OTHER): Payer: No Typology Code available for payment source | Admitting: Physician Assistant

## 2019-06-06 ENCOUNTER — Other Ambulatory Visit: Payer: Self-pay

## 2019-06-06 VITALS — BP 152/96 | HR 94 | Temp 98.2°F | Ht 62.0 in | Wt 181.0 lb

## 2019-06-06 DIAGNOSIS — R9431 Abnormal electrocardiogram [ECG] [EKG]: Secondary | ICD-10-CM | POA: Diagnosis not present

## 2019-06-06 DIAGNOSIS — G90A Postural orthostatic tachycardia syndrome (POTS): Secondary | ICD-10-CM

## 2019-06-06 DIAGNOSIS — I498 Other specified cardiac arrhythmias: Secondary | ICD-10-CM

## 2019-06-06 DIAGNOSIS — I341 Nonrheumatic mitral (valve) prolapse: Secondary | ICD-10-CM

## 2019-06-06 DIAGNOSIS — E781 Pure hyperglyceridemia: Secondary | ICD-10-CM

## 2019-06-06 MED ORDER — FENOFIBRATE 54 MG PO TABS: 54.0000 mg | ORAL_TABLET | Freq: Every day | ORAL | 3 refills | Status: DC

## 2019-06-06 NOTE — Progress Notes (Signed)
Cardiology Office Note:    Date:  06/07/2019   ID:  Brittany Sampson, DOB Sep 29, 1980, MRN [id number]  PCP:  Cheron Schaumann., MD  Cardiologist:  Thurmon Fair, MD  Electrophysiologist:  None   Referring MD: Cheron Schaumann.,*   Chief Complaint  Patient presents with  . Follow-up    6 mo followup, seen for Dr. Royann Shivers    History of Present Illness:    Brittany Sampson is a 38 y.o. female with a hx of mitral valve prolapse, controlled postural orthostatic tachycardia syndrome, hypertriglyceridemia and borderline prolonged QT.  POTS syndrome has improved after she started taking citalopram for anxiety.  She does not have any palpitation.  Her triglyceride is been controlled on fenofibrate.   I last saw the patient via virtual visit on 11/12/2018 at which time she was doing well.  She presents today for cardiology office visit.  She denies any significant dizzy spell or fluttering sensation in her chest.  Celexa seems to be controlling her symptoms quite well.  EKG today showed that her QTC is very much normal.  On physical exam, I do not hear significant heart murmur.  Overall, she is doing quite well from cardiology perspective and denies any obvious chest discomfort or or shortness of breath.  Her blood pressure is mildly elevated today however normally it is quite well controlled.  I will refill her fenofibrate and she can follow-up in 6 months.   Past Medical History:  Diagnosis Date  . Diabetes mellitus   . Hyperlipidemia   . Hyperlipidemia   . Mitral valve prolapse   . POTS (postural orthostatic tachycardia syndrome)     Past Surgical History:  Procedure Laterality Date  . APPENDECTOMY    . APPENDECTOMY    . TRANSTHORACIC ECHOCARDIOGRAM  12/14/2009   LV SYSTOLIC FUNCTION NORMAL. EF =>55%. LEFT ATRIAL SIZE NORMAL. MITRAL VALVE LEAFLETS APPEAR THICKENED, BUT OPEN WELL. MILD MVP. PROLAPSE OF THE ANTERIOR MITRAL LEAFLET. PROLAPSE OF THE POSTERIOR MITRAL LEAFLETS.  MILD MR. MILD BI-LEAFLET MVP WITHOUT SIGN MR.    Current Medications: Current Meds  Medication Sig  . fenofibrate 54 MG tablet Take 1 tablet (54 mg total) by mouth daily.  . montelukast (SINGULAIR) 10 MG tablet Take 10 mg by mouth daily.  . [DISCONTINUED] fenofibrate 54 MG tablet TAKE 1 TABLET BY MOUTH DAILY     Allergies:   Patient has no known allergies.   Social History   Socioeconomic History  . Marital status: Married    Spouse name: Not on file  . Number of children: Not on file  . Years of education: Not on file  . Highest education level: Not on file  Occupational History  . Not on file  Tobacco Use  . Smoking status: Former Games developer  . Smokeless tobacco: Never Used  Substance and Sexual Activity  . Alcohol use: No  . Drug use: No  . Sexual activity: Yes  Other Topics Concern  . Not on file  Social History Narrative  . Not on file   Social Determinants of Health   Financial Resource Strain:   . Difficulty of Paying Living Expenses: Not on file  Food Insecurity:   . Worried About Programme researcher, broadcasting/film/video in the Last Year: Not on file  . Ran Out of Food in the Last Year: Not on file  Transportation Needs:   . Lack of Transportation (Medical): Not on file  . Lack of Transportation (Non-Medical): Not on file  Physical Activity:   .  Days of Exercise per Week: Not on file  . Minutes of Exercise per Session: Not on file  Stress:   . Feeling of Stress : Not on file  Social Connections:   . Frequency of Communication with Friends and Family: Not on file  . Frequency of Social Gatherings with Friends and Family: Not on file  . Attends Religious Services: Not on file  . Active Member of Clubs or Organizations: Not on file  . Attends BankerClub or Organization Meetings: Not on file  . Marital Status: Not on file     Family History: The patient's family history includes Brain cancer in her brother; Diabetes Mellitus I in her brother; Healthy in her father; Heart attack in  her brother; Heart disease in her brother; Hyperlipidemia in her mother; Hypertension in her mother; Melanoma in her brother. There is no history of Other.  ROS:   Please see the history of present illness.     All other systems reviewed and are negative.  EKGs/Labs/Other Studies Reviewed:    The following studies were reviewed today:  N/A  EKG:  EKG is ordered today.  The ekg ordered today demonstrates sinus rhythm without significant ST-T wave changes  Recent Labs: No results found for requested labs within last 8760 hours.  Recent Lipid Panel No results found for: CHOL, TRIG, HDL, CHOLHDL, VLDL, LDLCALC, LDLDIRECT  Physical Exam:    VS:  BP (!) 152/96   Pulse 94   Temp 98.2 F (36.8 C)   Ht 5\' 2"  (1.575 m)   Wt 181 lb (82.1 kg)   SpO2 100%   BMI 33.11 kg/m     Wt Readings from Last 3 Encounters:  06/06/19 181 lb (82.1 kg)  11/12/18 175 lb (79.4 kg)  01/30/17 169 lb (76.7 kg)     GEN:  Well nourished, well developed in no acute distress HEENT: Normal NECK: No JVD; No carotid bruits LYMPHATICS: No lymphadenopathy CARDIAC: RRR, no murmurs, rubs, gallops RESPIRATORY:  Clear to auscultation without rales, wheezing or rhonchi  ABDOMEN: Soft, non-tender, non-distended MUSCULOSKELETAL:  No edema; No deformity  SKIN: Warm and dry NEUROLOGIC:  Alert and oriented x 3 PSYCHIATRIC:  Normal affect   ASSESSMENT:    1. Prolonged Q-T interval on ECG   2. Mitral valve prolapse   3. POTS (postural orthostatic tachycardia syndrome)   4. Hypertriglyceridemia    PLAN:    In order of problems listed above:  1. Borderline prolonged QTC: Anxiety issue remain well controlled on Celexa.  QTC is within normal limits on today's EKG.  She has no episode of dizziness  2. Mitral valve prolapse: No sign of heart murmur on physical exam  3. Controlled POTS disorder: Anxiety issue is controlled on Celexa.  Patient does not have any significant dizzy spell.   4. Hypertriglyceridemia:  Continue fenofibrate.   Medication Adjustments/Labs and Tests Ordered: Current medicines are reviewed at length with the patient today.  Concerns regarding medicines are outlined above.  Orders Placed This Encounter  Procedures  . EKG 12-Lead   Meds ordered this encounter  Medications  . fenofibrate 54 MG tablet    Sig: Take 1 tablet (54 mg total) by mouth daily.    Dispense:  90 tablet    Refill:  3    Patient Instructions  Medication Instructions:  Your physician recommends that you continue on your current medications as directed. Please refer to the Current Medication list given to you today. *If you need a refill on  your cardiac medications before your next appointment, please call your pharmacy*  Lab Work: None  If you have labs (blood work) drawn today and your tests are completely normal, you will receive your results only by: Marland Kitchen MyChart Message (if you have MyChart) OR . A paper copy in the mail If you have any lab test that is abnormal or we need to change your treatment, we will call you to review the results.  Testing/Procedures: None   Follow-Up: At [facility], you and your health needs are our priority.  As part of our continuing mission to provide you with exceptional heart care, we have created designated Provider Care Teams.  These Care Teams include your primary Cardiologist (physician) and Advanced Practice Providers (APPs -  Physician Assistants and Nurse Practitioners) who all work together to provide you with the care you need, when you need it.  Your next appointment:   6-8 month(s)  The format for your next appointment:   In Person  Provider:   Sanda Klein, MD  Other Instructions     Signed, Almyra Deforest, [place]  06/07/2019 2:03 PM    [place]

## 2019-06-06 NOTE — Patient Instructions (Signed)
Medication Instructions:  Your physician recommends that you continue on your current medications as directed. Please refer to the Current Medication list given to you today. *If you need a refill on your cardiac medications before your next appointment, please call your pharmacy*  Lab Work: None  If you have labs (blood work) drawn today and your tests are completely normal, you will receive your results only by: Marland Kitchen MyChart Message (if you have MyChart) OR . A paper copy in the mail If you have any lab test that is abnormal or we need to change your treatment, we will call you to review the results.  Testing/Procedures: None   Follow-Up: At [facility], you and your health needs are our priority.  As part of our continuing mission to provide you with exceptional heart care, we have created designated Provider Care Teams.  These Care Teams include your primary Cardiologist (physician) and Advanced Practice Providers (APPs -  Physician Assistants and Nurse Practitioners) who all work together to provide you with the care you need, when you need it.  Your next appointment:   6-8 month(s)  The format for your next appointment:   In Person  Provider:   Sanda Klein, MD  Other Instructions

## 2019-06-07 ENCOUNTER — Encounter: Payer: Self-pay | Admitting: Physician Assistant

## 2019-12-05 NOTE — Telephone Encounter (Signed)
I think either one is a good choice in Laisha's case

## 2020-01-19 ENCOUNTER — Ambulatory Visit: Payer: No Typology Code available for payment source | Admitting: Cardiovascular Disease

## 2020-03-13 ENCOUNTER — Ambulatory Visit (INDEPENDENT_AMBULATORY_CARE_PROVIDER_SITE_OTHER): Payer: No Typology Code available for payment source | Admitting: Cardiovascular Disease

## 2020-03-13 ENCOUNTER — Encounter: Payer: Self-pay | Admitting: Cardiovascular Disease

## 2020-03-13 ENCOUNTER — Other Ambulatory Visit: Payer: Self-pay

## 2020-03-13 VITALS — BP 148/86 | HR 71 | Ht 62.0 in | Wt 177.8 lb

## 2020-03-13 DIAGNOSIS — R9431 Abnormal electrocardiogram [ECG] [EKG]: Secondary | ICD-10-CM

## 2020-03-13 DIAGNOSIS — E782 Mixed hyperlipidemia: Secondary | ICD-10-CM | POA: Diagnosis not present

## 2020-03-13 DIAGNOSIS — I498 Other specified cardiac arrhythmias: Secondary | ICD-10-CM

## 2020-03-13 DIAGNOSIS — I341 Nonrheumatic mitral (valve) prolapse: Secondary | ICD-10-CM

## 2020-03-13 DIAGNOSIS — G90A Postural orthostatic tachycardia syndrome (POTS): Secondary | ICD-10-CM

## 2020-03-13 NOTE — Progress Notes (Signed)
Cardiology Office Note    Date:  03/13/2020   ID:  Brittany Sampson, DOB [birth year], MRN [id number]  PCP:  Brittany Sampson., MD  Cardiologist:   Thurmon Fair, MD   chief complaint: Follow-up hyperlipidemia and POTS   History of Present Illness:  Brittany Sampson is a 39 y.o. female with asymptomatic mitral valve prolapse without mitral regurgitation, well controlled postural orthostatic tachycardia syndrome, treated hypertriglyceridemia and borderline prolonged QT interval, here for routine follow-up.  She is doing quite well and has no cardiovascular complaints.  She switched to Lexapro for anxiety and her QT is still in normal range.  She has not had issues with palpitations.  The patient specifically denies any chest pain at rest exertion, dyspnea at rest or with exertion, orthopnea, paroxysmal nocturnal dyspnea, syncope, palpitations, focal neurological deficits, intermittent claudication, lower extremity edema, unexplained weight gain, cough, hemoptysis or wheezing.  Her triglyceride level has remained acceptable at 160 on a lower dose of fenofibrate.  On the other hand her LDL cholesterol is high at 144.  Her mother is also my patient and has a similar lipid profile, although she has not had coronary problems.  Her brother Brittany Sampson has extensive CAD, but also has type 1 diabetes.  Past Medical History:  Diagnosis Date  . Diabetes mellitus   . Hyperlipidemia   . Hyperlipidemia   . Mitral valve prolapse   . POTS (postural orthostatic tachycardia syndrome)     Past Surgical History:  Procedure Laterality Date  . APPENDECTOMY    . APPENDECTOMY    . TRANSTHORACIC ECHOCARDIOGRAM  12/14/2009   LV SYSTOLIC FUNCTION NORMAL. EF =>55%. LEFT ATRIAL SIZE NORMAL. MITRAL VALVE LEAFLETS APPEAR THICKENED, BUT OPEN WELL. MILD MVP. PROLAPSE OF THE ANTERIOR MITRAL LEAFLET. PROLAPSE OF THE POSTERIOR MITRAL LEAFLETS. MILD MR. MILD BI-LEAFLET MVP WITHOUT SIGN MR.    Current  Medications: Outpatient Medications Prior to Visit  Medication Sig Dispense Refill  . escitalopram (LEXAPRO) 10 MG tablet Take 10 mg by mouth daily.    . fenofibrate 54 MG tablet Take 1 tablet (54 mg total) by mouth daily. 90 tablet 3  . montelukast (SINGULAIR) 10 MG tablet Take 10 mg by mouth daily.     No facility-administered medications prior to visit.     Allergies:   Patient has no known allergies.   Social History   Socioeconomic History  . Marital status: Married    Spouse name: Not on file  . Number of children: Not on file  . Years of education: Not on file  . Highest education level: Not on file  Occupational History  . Not on file  Tobacco Use  . Smoking status: Former Games developer  . Smokeless tobacco: Never Used  Substance and Sexual Activity  . Alcohol use: No  . Drug use: No  . Sexual activity: Yes  Other Topics Concern  . Not on file  Social History Narrative  . Not on file   Social Determinants of Health   Financial Resource Strain:   . Difficulty of Paying Living Expenses: Not on file  Food Insecurity:   . Worried About Programme researcher, broadcasting/film/video in the Last Year: Not on file  . Ran Out of Food in the Last Year: Not on file  Transportation Needs:   . Lack of Transportation (Medical): Not on file  . Lack of Transportation (Non-Medical): Not on file  Physical Activity:   . Days of Exercise per Week: Not on file  . Minutes of Exercise  per Session: Not on file  Stress:   . Feeling of Stress : Not on file  Social Connections:   . Frequency of Communication with Friends and Family: Not on file  . Frequency of Social Gatherings with Friends and Family: Not on file  . Attends Religious Services: Not on file  . Active Member of Clubs or Organizations: Not on file  . Attends Banker Meetings: Not on file  . Marital Status: Not on file     Family History:  The patient's family history includes Brain cancer in her brother; Diabetes Mellitus I in her  brother; Healthy in her father; Heart attack in her brother; Heart disease in her brother; Hyperlipidemia in her mother; Hypertension in her mother; Melanoma in her brother.   ROS:   Please see the history of present illness.    ROS All other systems are reviewed and are negative.   PHYSICAL EXAM:   VS:  BP (!) 148/86   Pulse 71   Ht 5\' 2"  (1.575 m)   Wt 177 lb 12.8 oz (80.6 kg)   SpO2 100%   BMI 32.52 kg/m     General: Alert, oriented x3, no distress, mildly obese Head: no evidence of trauma, PERRL, EOMI, no exophtalmos or lid lag, no myxedema, no xanthelasma; normal ears, nose and oropharynx Neck: normal jugular venous pulsations and no hepatojugular reflux; brisk carotid pulses without delay and no carotid bruits Chest: clear to auscultation, no signs of consolidation by percussion or palpation, normal fremitus, symmetrical and full respiratory excursions Cardiovascular: normal position and quality of the apical impulse, regular rhythm, normal first and second heart sounds, no murmurs, rubs or gallops.  She has a faint systolic click at the apex, that intensifies with the Valsalva maneuver.  There is no inducible murmur. Abdomen: no tenderness or distention, no masses by palpation, no abnormal pulsatility or arterial bruits, normal bowel sounds, no hepatosplenomegaly Extremities: no clubbing, cyanosis or edema; 2+ radial, ulnar and brachial pulses bilaterally; 2+ right femoral, posterior tibial and dorsalis pedis pulses; 2+ left femoral, posterior tibial and dorsalis pedis pulses; no subclavian or femoral bruits Neurological: grossly nonfocal Psych: Normal mood and affect   Wt Readings from Last 3 Encounters:  03/13/20 177 lb 12.8 oz (80.6 kg)  06/06/19 181 lb (82.1 kg)  11/12/18 175 lb (79.4 kg)      Studies/Labs Reviewed:   EKG:  EKG is ordered today.  It is a normal tracing, QTC 447 ms Recent Labs: Hemoglobin 13.3, borderline high platelets 405, creatinine 0.6, normal  glucose  Lipid Panel 10/23/2016 total cholesterol 193, triglycerides 82, HDL 52, calculated LDL 125.  Total cholesterol 228, HDL 52, LDL 144, triglycerides 160 Glucose 94, potassium 4.1, creatinine 0.69, TSH 1.87, normal liver function tests  ASSESSMENT:    1. Mitral valve prolapse   2. POTS (postural orthostatic tachycardia syndrome)   3. Mixed hyperlipidemia   4. Prolonged QT interval      PLAN:  In order of problems listed above:  1. POTS: On SSRI, asymptomatic with lifestyle changes. 2. MVP: No murmur on exam. 3. HLP: Triglyceride levels are acceptable on the current dose of fenofibrate.  Concerned about the LDL cholesterol.  Needs to improve diet and exercise.  If after her best efforts the LDL remains over 130 would recommend starting a statin. 4. LQT: Borderline on previous tracings, normal on current ECG.  No worsening on Lexapro.  No family history of arrhythmia/syncope/unexplained sudden death.    Medication Adjustments/Labs  and Tests Ordered: Current medicines are reviewed at length with the patient today.  Concerns regarding medicines are outlined above.  Medication changes, Labs and Tests ordered today are listed in the Patient Instructions below. Patient Instructions  Medication Instructions:  No changes *If you need a refill on your cardiac medications before your next appointment, please call your pharmacy*   Lab Work: None ordered If you have labs (blood work) drawn today and your tests are completely normal, you will receive your results only by: Marland Kitchen MyChart Message (if you have MyChart) OR . A paper copy in the mail If you have any lab test that is abnormal or we need to change your treatment, we will call you to review the results.   Testing/Procedures: None ordered   Follow-Up: At [facility], you and your health needs are our priority.  As part of our continuing mission to provide you with exceptional heart care, we have created designated  Provider Care Teams.  These Care Teams include your primary Cardiologist (physician) and Advanced Practice Providers (APPs -  Physician Assistants and Nurse Practitioners) who all work together to provide you with the care you need, when you need it.  We recommend signing up for the patient portal called "MyChart".  Sign up information is provided on this After Visit Summary.  MyChart is used to connect with patients for Virtual Visits (Telemedicine).  Patients are able to view lab/test results, encounter notes, upcoming appointments, etc.  Non-urgent messages can be sent to your provider as well.   To learn more about what you can do with MyChart, go to [email or web address].    Your next appointment:   12 months  The format for your next appointment:   In Person  Provider:   Thurmon Fair, MD     Signed, Thurmon Fair, MD  03/13/2020 10:39 AM    [facility] [address] [place], [place], [place]  [address] Phone: [phone]; Fax: [phone]

## 2020-03-13 NOTE — Patient Instructions (Signed)

## 2020-06-30 ENCOUNTER — Other Ambulatory Visit: Payer: Self-pay | Admitting: Physician Assistant

## 2020-11-10 ENCOUNTER — Other Ambulatory Visit: Payer: Self-pay | Admitting: Cardiovascular Disease

## 2021-04-11 ENCOUNTER — Other Ambulatory Visit: Payer: Self-pay | Admitting: Cardiovascular Disease

## 2021-04-28 NOTE — Progress Notes (Deleted)
Cardiology Office Note:    Date:  04/28/2021   ID:  Brittany Sampson, DOB Mar 04, 1981, MRN [id number]  PCP:  Cheron Schaumann., MD  Cardiologist:  Thurmon Fair, MD   Referring MD: Cheron Schaumann.,*   No chief complaint on file. ***  History of Present Illness:    Brittany Sampson is a 40 y.o. female with a hx of POTS (controlled), mitral valve prolapse without MR, and hypertriglyceridemia on fenofibrate.   Recent lipid profile: LDL: 136 (144) HDL: 54 Triglycerides: 116 (160) Low cholesterol: 207 (228)  Dr. Royann Shivers reviewed these labs and did not recommend any medication changes at this time.  He did recommend dietary changes.    POTS No recent syncope, well-controlled. SSRI helps.    MVP - no murmur on exam   Hyperlipidemia Continue fenofibrate. She is committed to dietary changes.    Prolonged QT - borderline on prior tracings Stable on Lexapro EKG today with      Past Medical History:  Diagnosis Date   Diabetes mellitus    Hyperlipidemia    Hyperlipidemia    Mitral valve prolapse    POTS (postural orthostatic tachycardia syndrome)     Past Surgical History:  Procedure Laterality Date   APPENDECTOMY     APPENDECTOMY     TRANSTHORACIC ECHOCARDIOGRAM  12/14/2009   LV SYSTOLIC FUNCTION NORMAL. EF =>55%. LEFT ATRIAL SIZE NORMAL. MITRAL VALVE LEAFLETS APPEAR THICKENED, BUT OPEN WELL. MILD MVP. PROLAPSE OF THE ANTERIOR MITRAL LEAFLET. PROLAPSE OF THE POSTERIOR MITRAL LEAFLETS. MILD MR. MILD BI-LEAFLET MVP WITHOUT SIGN MR.    Current Medications: No outpatient medications have been marked as taking for the 05/03/21 encounter (Appointment) with Marcelino Duster, PA.     Allergies:   Patient has no known allergies.   Social History   Socioeconomic History   Marital status: Married    Spouse name: Not on file   Number of children: Not on file   Years of education: Not on file   Highest education level: Not on file  Occupational  History   Not on file  Tobacco Use   Smoking status: Former   Smokeless tobacco: Never  Substance and Sexual Activity   Alcohol use: No   Drug use: No   Sexual activity: Yes  Other Topics Concern   Not on file  Social History Narrative   Not on file   Social Determinants of Health   Financial Resource Strain: Not on file  Food Insecurity: Not on file  Transportation Needs: Not on file  Physical Activity: Not on file  Stress: Not on file  Social Connections: Not on file     Family History: The patient's ***family history includes Brain cancer in her brother; Diabetes Mellitus I in her brother; Healthy in her father; Heart attack in her brother; Heart disease in her brother; Hyperlipidemia in her mother; Hypertension in her mother; Melanoma in her brother. There is no history of Other.  ROS:   Please see the history of present illness.    *** All other systems reviewed and are negative.  EKGs/Labs/Other Studies Reviewed:    The following studies were reviewed today: ***  EKG:  EKG is *** ordered today.  The ekg ordered today demonstrates ***  Recent Labs: No results found for requested labs within last 8760 hours.  Recent Lipid Panel No results found for: CHOL, TRIG, HDL, CHOLHDL, VLDL, LDLCALC, LDLDIRECT  Physical Exam:    VS:  There were no vitals taken for this visit.  Wt Readings from Last 3 Encounters:  03/13/20 177 lb 12.8 oz (80.6 kg)  06/06/19 181 lb (82.1 kg)  11/12/18 175 lb (79.4 kg)     GEN: *** Well nourished, well developed in no acute distress HEENT: Normal NECK: No JVD; No carotid bruits LYMPHATICS: No lymphadenopathy CARDIAC: ***RRR, no murmurs, rubs, gallops RESPIRATORY:  Clear to auscultation without rales, wheezing or rhonchi  ABDOMEN: Soft, non-tender, non-distended MUSCULOSKELETAL:  No edema; No deformity  SKIN: Warm and dry NEUROLOGIC:  Alert and oriented x 3 PSYCHIATRIC:  Normal affect   ASSESSMENT:    No diagnosis  found. PLAN:    In order of problems listed above:  No diagnosis found.   Medication Adjustments/Labs and Tests Ordered: Current medicines are reviewed at length with the patient today.  Concerns regarding medicines are outlined above.  No orders of the defined types were placed in this encounter.  No orders of the defined types were placed in this encounter.   Signed, Marcelino Duster, [place]  04/28/2021 9:49 PM    [facility]

## 2021-05-03 ENCOUNTER — Ambulatory Visit: Payer: No Typology Code available for payment source | Admitting: Physician Assistant

## 2021-07-07 NOTE — Progress Notes (Signed)
Cardiology Office Note:    Date:  07/07/2021   ID:  Brittany Sampson, DOB August 27, 1980, MRN [id number]  PCP:  Cheron Schaumann., MD   [facility] Providers Cardiologist:  Thurmon Fair, MD { Referring MD: Cheron Schaumann.,*   No chief complaint on file. Yearly follow-up  History of Present Illness:    Brittany Sampson is a 41 y.o. female with a hx of POTS (controlled), mitral valve prolapse without MR, and hypertriglyceridemia on fenofibrate.   Recent lipid profile: LDL: 136 (144) HDL: 54 Triglycerides: 116 (160) Low cholesterol: 207 (228)  Dr. Royann Shivers reviewed these labs and did not recommend any medication changes at this time.  He did recommend dietary changes.  Today she states that she does have some sharp occasional pain in her right breast area which tends to be related to stress.  Only lasts a few seconds to about 5 minutes.  When she takes a deep breath is more prominent.  She has not done any chest exercises lately.  I have a low suspicion for cardiac related pain due to her symptoms.  She works as a Armed forces operational officer and since switching to a lower stress job she has noticed these sharp pains occur less frequently.  We discussed changing her diet to support a more favorable LDL.  We plan to repeat a lipid panel in 3 months and if she is not well controlled at that time we will plan to start a statin.  She also has history of mitral valve prolapse and at one point was told that a repeat echocardiogram would be performed after giving birth to her child.  We have ordered an echocardiogram today.  She does not have a murmur or any symptoms at this time.  Reports no shortness of breath nor dyspnea on exertion.  No edema, orthopnea, PND. Reports no palpitations.     Past Medical History:  Diagnosis Date   Diabetes mellitus    Hyperlipidemia    Hyperlipidemia    Mitral valve prolapse    POTS (postural orthostatic tachycardia syndrome)     Past Surgical  History:  Procedure Laterality Date   APPENDECTOMY     APPENDECTOMY     TRANSTHORACIC ECHOCARDIOGRAM  12/14/2009   LV SYSTOLIC FUNCTION NORMAL. EF =>55%. LEFT ATRIAL SIZE NORMAL. MITRAL VALVE LEAFLETS APPEAR THICKENED, BUT OPEN WELL. MILD MVP. PROLAPSE OF THE ANTERIOR MITRAL LEAFLET. PROLAPSE OF THE POSTERIOR MITRAL LEAFLETS. MILD MR. MILD BI-LEAFLET MVP WITHOUT SIGN MR.    Current Medications: No outpatient medications have been marked as taking for the 07/11/21 encounter (Appointment) with Marcelino Duster, PA.     Allergies:   Patient has no known allergies.   Social History   Socioeconomic History   Marital status: Married    Spouse name: Not on file   Number of children: Not on file   Years of education: Not on file   Highest education level: Not on file  Occupational History   Not on file  Tobacco Use   Smoking status: Former   Smokeless tobacco: Never  Substance and Sexual Activity   Alcohol use: No   Drug use: No   Sexual activity: Yes  Other Topics Concern   Not on file  Social History Narrative   Not on file   Social Determinants of Health   Financial Resource Strain: Not on file  Food Insecurity: Not on file  Transportation Needs: Not on file  Physical Activity: Not on file  Stress: Not on file  Social Connections: Not on file     Family History: The patient's family history includes Brain cancer in her brother; Diabetes Mellitus I in her brother; Healthy in her father; Heart attack in her brother; Heart disease in her brother; Hyperlipidemia in her mother; Hypertension in her mother; Melanoma in her brother. There is no history of Other.  ROS:   Please see the history of present illness.     All other systems reviewed and are negative.  EKGs/Labs/Other Studies Reviewed:    The following studies were reviewed today: No tests in the last 10 years  EKG:  EKG is  ordered today.  The ekg ordered today demonstrates normal sinus rhythm, rate 72  bpm  Recent Labs: No results found for requested labs within last 8760 hours.  Recent Lipid Panel No results found for: CHOL, TRIG, HDL, CHOLHDL, VLDL, LDLCALC, LDLDIRECT       Physical Exam:    VS:  There were no vitals taken for this visit.    Wt Readings from Last 3 Encounters:  03/13/20 177 lb 12.8 oz (80.6 kg)  06/06/19 181 lb (82.1 kg)  11/12/18 175 lb (79.4 kg)     GEN: Well nourished, well developed in no acute distress HEENT: Normal NECK: No JVD; No carotid bruits LYMPHATICS: No lymphadenopathy CARDIAC: RRR, no murmurs, rubs, gallops RESPIRATORY:  Clear to auscultation without rales, wheezing or rhonchi  ABDOMEN: Soft, non-tender, non-distended MUSCULOSKELETAL:  No edema; No deformity  SKIN: Warm and dry NEUROLOGIC:  Alert and oriented x 3 PSYCHIATRIC:  Normal affect   ASSESSMENT:    No diagnosis found. PLAN:    In order of problems listed above:  POTS -No recent syncope, well-controlled. SSRI helps.    2. MVP - no murmur on exam -We will order echocardiogram today -Asymptomatic at this time   3. Hyperlipidemia -Continue fenofibrate. She is committed to dietary changes.  -Due for repeat lipid profile. If LDL over 130, Dr. Royann Shivers has recommended starting a statin.  -LDL coming down and was 136  -Recommend lifestyle changes and we will plan to redraw lipid panel in 3 months.  She is aware that she will need to start statin therapy at this time if her LDL remains >130.    4. 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  Concerns regarding medicines are outlined above.  No orders of the defined types were placed in this encounter.  No orders of the defined types were placed in this encounter.   There are no Patient Instructions on file for this visit.   Signed, Marcelino Duster, PA  07/07/2021 3:12 PM    [facility]

## 2021-07-11 ENCOUNTER — Ambulatory Visit: Payer: No Typology Code available for payment source | Admitting: Physician Assistant

## 2021-07-11 ENCOUNTER — Other Ambulatory Visit: Payer: Self-pay

## 2021-07-11 ENCOUNTER — Encounter: Payer: Self-pay | Admitting: Physician Assistant

## 2021-07-11 VITALS — BP 134/90 | HR 72 | Ht 62.0 in | Wt 178.6 lb

## 2021-07-11 DIAGNOSIS — G90A Postural orthostatic tachycardia syndrome (POTS): Secondary | ICD-10-CM | POA: Diagnosis not present

## 2021-07-11 DIAGNOSIS — E782 Mixed hyperlipidemia: Secondary | ICD-10-CM | POA: Diagnosis not present

## 2021-07-11 DIAGNOSIS — R9431 Abnormal electrocardiogram [ECG] [EKG]: Secondary | ICD-10-CM | POA: Diagnosis not present

## 2021-07-11 DIAGNOSIS — I341 Nonrheumatic mitral (valve) prolapse: Secondary | ICD-10-CM | POA: Diagnosis not present

## 2021-07-11 NOTE — Patient Instructions (Signed)
Medication Instructions:  Your physician recommends that you continue on your current medications as directed. Please refer to the Current Medication list given to you today.   *If you need a refill on your cardiac medications before your next appointment, please call your pharmacy*   Lab Work: Your physician recommends that you return for lab work in 3 months Lipid   If you have labs (blood work) drawn today and your tests are completely normal, you will receive your results only by: [place] (if you have MyChart) OR A paper copy in the mail If you have any lab test that is abnormal or we need to change your treatment, we will call you to review the results.   Testing/Procedures: Your physician has requested that you have an echocardiogram. Echocardiography is a painless test that uses sound waves to create images of your heart. It provides your doctor with information about the size and shape of your heart and how well your hearts chambers and valves are working. This procedure takes approximately one hour. There are no restrictions for this procedure.     Follow-Up: At [facility], you and your health needs are our priority.  As part of our continuing mission to provide you with exceptional heart care, we have created designated Provider Care Teams.  These Care Teams include your primary Cardiologist (physician) and Advanced Practice Providers (APPs -  Physician Assistants and Nurse Practitioners) who all work together to provide you with the care you need, when you need it.  We recommend signing up for the patient portal called "MyChart".  Sign up information is provided on this After Visit Summary.  MyChart is used to connect with patients for Virtual Visits (Telemedicine).  Patients are able to view lab/test results, encounter notes, upcoming appointments, etc.  Non-urgent messages can be sent to your provider as well.   To learn more about what you can do with MyChart, go  to [email or web address].    Your next appointment:   3 month(s)  The format for your next appointment:   In Person  Provider:   APP        Other Instructions

## 2021-07-19 ENCOUNTER — Ambulatory Visit (HOSPITAL_COMMUNITY): Payer: No Typology Code available for payment source | Attending: Physician Assistant

## 2021-07-19 ENCOUNTER — Other Ambulatory Visit: Payer: Self-pay

## 2021-07-19 DIAGNOSIS — I341 Nonrheumatic mitral (valve) prolapse: Secondary | ICD-10-CM | POA: Diagnosis present

## 2021-07-19 LAB — ECHOCARDIOGRAM COMPLETE
Area-P 1/2: 3.83 cm2
S' Lateral: 2.6 cm

## 2021-09-27 ENCOUNTER — Ambulatory Visit: Payer: No Typology Code available for payment source | Admitting: Physician Assistant

## 2021-10-07 ENCOUNTER — Other Ambulatory Visit: Payer: Self-pay | Admitting: Obstetrics and Gynecology

## 2021-10-07 DIAGNOSIS — R928 Other abnormal and inconclusive findings on diagnostic imaging of breast: Secondary | ICD-10-CM

## 2021-10-21 ENCOUNTER — Ambulatory Visit
Admission: RE | Admit: 2021-10-21 | Discharge: 2021-10-21 | Disposition: A | Discharge status: Home / Self Care | Payer: No Typology Code available for payment source | Source: Ambulatory Visit | Attending: Obstetrics and Gynecology | Admitting: Obstetrics and Gynecology

## 2021-10-21 ENCOUNTER — Other Ambulatory Visit: Payer: Self-pay | Admitting: Obstetrics and Gynecology

## 2021-10-21 DIAGNOSIS — N6489 Other specified disorders of breast: Secondary | ICD-10-CM

## 2021-10-21 DIAGNOSIS — R928 Other abnormal and inconclusive findings on diagnostic imaging of breast: Secondary | ICD-10-CM

## 2021-11-12 ENCOUNTER — Other Ambulatory Visit: Payer: Self-pay | Admitting: Cardiovascular Disease

## 2021-12-16 ENCOUNTER — Ambulatory Visit
Admission: RE | Admit: 2021-12-16 | Discharge: 2021-12-16 | Disposition: A | Discharge status: Home / Self Care | Payer: No Typology Code available for payment source | Source: Ambulatory Visit

## 2021-12-16 ENCOUNTER — Ambulatory Visit (INDEPENDENT_AMBULATORY_CARE_PROVIDER_SITE_OTHER): Payer: No Typology Code available for payment source

## 2021-12-16 VITALS — BP 150/92 | HR 97 | Temp 98.7°F | Resp 15

## 2021-12-16 DIAGNOSIS — R059 Cough, unspecified: Secondary | ICD-10-CM | POA: Diagnosis not present

## 2021-12-16 MED ORDER — FLUTICASONE PROPIONATE 50 MCG/ACT NA SUSP: 2.0000 | Freq: Every day | NASAL | 0 refills | Status: DC

## 2021-12-16 MED ORDER — DOXYCYCLINE HYCLATE 100 MG PO TABS: 100.0000 mg | ORAL_TABLET | Freq: Two times a day (BID) | ORAL | 0 refills | Status: DC

## 2021-12-16 NOTE — Discharge Instructions (Addendum)
Your x-ray is negative for bronchitis, pneumonia, or other lung etiology. Take medication as directed. Continue your current allergy medication regimen. Increase fluids and get plenty of rest. May take over-the-counter ibuprofen or Tylenol as needed for pain, fever, or general discomfort. Recommend normal saline nasal spray to help with nasal congestion throughout the day. For your cough, it may be helpful to use a humidifier at bedtime during sleep. If your symptoms fail to improve within the next 7 to 10 days, please follow-up in our clinic.

## 2021-12-16 NOTE — ED Provider Notes (Signed)
[facility]    CSN: [id number] Arrival date & time: 12/16/21  0846      History   Chief Complaint Chief Complaint  Patient presents with   Cough    I have had a cough and nasal congestion for 2 mths. I have seen my primary physician and she recommended that I get a chest X-ray. - Entered by patient   Nasal Congestion    HPI Brittany Sampson is a 41 y.o. female.   The history is provided by the patient.    Patient presents with cough, nasal congestion, and sinus pressure that been present for the past 2 months.  Patient states that when her symptoms started, she went to urgent care and was prescribed written oral amoxicillin and given a round of prednisone.  She states she did not follow-up with her PCP who also gave her another prescription for prednisone.  Patient states she continues to experience cough that is productive of white to yellowish sputum.  She also complains of sinus pressure and continued nasal congestion.  She is unsure if she has had fever.  She denies sore throat, ear pain, shortness of breath, difficulty breathing, or GI symptoms.  Patient states she has also been taking Mucinex, Singulair, Zyrtec, which she has since switched over to Allegra-D.  She a recent smoking history.  Patient states her PCP recommended that she come to have a chest x-ray done.  Past Medical History:  Diagnosis Date   Diabetes mellitus    Hyperlipidemia    Hyperlipidemia    Mitral valve prolapse    POTS (postural orthostatic tachycardia syndrome)     Patient Active Problem List   Diagnosis Date Noted   Prolonged Q-T interval on ECG 11/03/2014   Mitral valve prolapse 11/03/2014   POTS (postural orthostatic tachycardia syndrome) 08/21/2013   Hypertriglyceridemia 08/21/2013    Past Surgical History:  Procedure Laterality Date   APPENDECTOMY     APPENDECTOMY     TRANSTHORACIC ECHOCARDIOGRAM  12/14/2009   LV SYSTOLIC FUNCTION NORMAL. EF =>55%. LEFT ATRIAL SIZE NORMAL.  MITRAL VALVE LEAFLETS APPEAR THICKENED, BUT OPEN WELL. MILD MVP. PROLAPSE OF THE ANTERIOR MITRAL LEAFLET. PROLAPSE OF THE POSTERIOR MITRAL LEAFLETS. MILD MR. MILD BI-LEAFLET MVP WITHOUT SIGN MR.    OB History     Gravida  2   Para  1   Term  1   Preterm  0   AB  0   Living  1      SAB  0   IAB  0   Ectopic  0   Multiple  0   Live Births  1            Home Medications    Prior to Admission medications   Medication Sig Start Date End Date Taking? Authorizing Provider  doxycycline (VIBRA-TABS) 100 MG tablet Take 1 tablet (100 mg total) by mouth 2 (two) times daily. 12/16/21  Yes Aneliese Beaudry-Warren, Sadie Haber, NP  fluticasone (FLONASE) 50 MCG/ACT nasal spray Place 2 sprays into both nostrils daily. 12/16/21  Yes Nael Petrosyan-Warren, Sadie Haber, NP  albuterol (VENTOLIN HFA) 108 (90 Base) MCG/ACT inhaler Inhale 2 puffs into the lungs every 6 (six) hours as needed. 11/13/21   [provider]  escitalopram (LEXAPRO) 10 MG tablet Take 10 mg by mouth daily. 03/02/20   [provider]  fenofibrate 54 MG tablet TAKE 1 TABLET BY MOUTH EVERY DAY 11/12/21   Croitoru, Mihai, MD  montelukast (SINGULAIR) 10 MG tablet Take 10  mg by mouth as needed. 04/15/19   [provider]    Family History Family History  Problem Relation Age of Onset   Hypertension Mother    Hyperlipidemia Mother    Healthy Father    Heart disease Brother    Diabetes Mellitus I Brother    Heart attack Brother    Melanoma Brother    Brain cancer Brother    Other Neg Hx     Social History Social History   Tobacco Use   Smoking status: Never   Smokeless tobacco: Never  Vaping Use   Vaping Use: Never used  Substance Use Topics   Alcohol use: No   Drug use: No     Allergies   Patient has no known allergies.   Review of Systems Review of Systems Per HPI  Physical Exam Triage Vital Signs ED Triage Vitals  Enc Vitals Group     BP 12/16/21 0900 (!) 150/92     Pulse Rate 12/16/21  0900 97     Resp 12/16/21 0900 15     Temp 12/16/21 0900 98.7 F (37.1 C)     Temp Source 12/16/21 0900 Oral     SpO2 12/16/21 0900 98 %     Weight --      Height --      Head Circumference --      Peak Flow --      Pain Score 12/16/21 0908 0     Pain Loc --      Pain Edu? --      Excl. in GC? --    No data found.  Updated Vital Signs BP (!) 150/92 (BP Location: Right Arm)   Pulse 97   Temp 98.7 F (37.1 C) (Oral)   Resp 15   LMP  (Within Weeks) Comment: 3 weeks  SpO2 98%   Visual Acuity Right Eye Distance:   Left Eye Distance:   Bilateral Distance:    Right Eye Near:   Left Eye Near:    Bilateral Near:     Physical Exam Vitals reviewed.  Constitutional:      General: She is not in acute distress.    Appearance: She is well-developed.  HENT:     Head: Normocephalic.     Right Ear: Tympanic membrane, ear canal and external ear normal.     Left Ear: Tympanic membrane, ear canal and external ear normal.     Nose: Congestion present.     Mouth/Throat:     Mouth: Mucous membranes are moist.  Eyes:     Extraocular Movements: Extraocular movements intact.     Conjunctiva/sclera: Conjunctivae normal.     Pupils: Pupils are equal, round, and reactive to light.  Cardiovascular:     Rate and Rhythm: Normal rate and regular rhythm.     Pulses: Normal pulses.     Heart sounds: Normal heart sounds.  Pulmonary:     Effort: Pulmonary effort is normal.     Breath sounds: Wheezing (expiratory wheezing in posterior RLL) present.  Abdominal:     General: Bowel sounds are normal. There is no distension.     Palpations: Abdomen is soft.     Tenderness: There is no abdominal tenderness. There is no guarding or rebound.  Genitourinary:    Vagina: Normal. No vaginal discharge.  Musculoskeletal:     Cervical back: Normal range of motion.  Lymphadenopathy:     Cervical: No cervical adenopathy.  Skin:    General: Skin is  warm and dry.     Findings: No erythema or rash.   Neurological:     General: No focal deficit present.     Mental Status: She is alert and oriented to person, place, and time.     Cranial Nerves: No cranial nerve deficit.  Psychiatric:        Mood and Affect: Mood normal.        Behavior: Behavior normal.      UC Treatments / Results  Labs (all labs ordered are listed, but only abnormal results are displayed) Labs Reviewed - No data to display  EKG   Radiology DG Chest 2 View  Result Date: 12/16/2021 CLINICAL DATA:  Cough for the past 2 months. EXAM: CHEST - 2 VIEW COMPARISON:  Chest x-ray dated July 03, 2008. FINDINGS: The heart size and mediastinal contours are within normal limits. Both lungs are clear. The visualized skeletal structures are unremarkable. IMPRESSION: No active cardiopulmonary disease. Electronically Signed   By: Obie Dredge M.D.   On: 12/16/2021 09:43    Procedures Procedures (including critical care time)  Medications Ordered in UC Medications - No data to display  Initial Impression / Assessment and Plan / UC Course  I have reviewed the triage vital signs and the nursing notes.  Pertinent labs & imaging results that were available during my care of the patient were reviewed by me and considered in my medical decision making (see chart for details).  Patient presents for complaints of cough and upper respiratory symptoms that been present for the past 2 months.  On exam, vital signs are stable, her lung sounds do show expiratory wheezing in the right lower lobe.  Chest x-ray was performed which was negative for pneumonia, bronchitis, or other lung etiology.  Differential diagnoses include bronchitis, asthma, sinusitis.  Will treat patient with doxycycline given the duration of her symptoms along with fluticasone.  Supportive care recommendations were provided to the patient.  Advised patient to follow-up with primary care if her symptoms do not improve.  Final Clinical Impressions(s) / UC Diagnoses    Final diagnoses:  Cough, unspecified type     Discharge Instructions      Your x-ray is negative for bronchitis, pneumonia, or other lung etiology. Take medication as directed. Continue your current allergy medication regimen. Increase fluids and get plenty of rest. May take over-the-counter ibuprofen or Tylenol as needed for pain, fever, or general discomfort. Recommend normal saline nasal spray to help with nasal congestion throughout the day. For your cough, it may be helpful to use a humidifier at bedtime during sleep. If your symptoms fail to improve within the next 7 to 10 days, please follow-up in our clinic.      ED Prescriptions     Medication Sig Dispense Auth. Provider   doxycycline (VIBRA-TABS) 100 MG tablet Take 1 tablet (100 mg total) by mouth 2 (two) times daily. 20 tablet Jeroline Wolbert-Warren, Sadie Haber, NP   fluticasone (FLONASE) 50 MCG/ACT nasal spray Place 2 sprays into both nostrils daily. 16 g Huntleigh Doolen-Warren, Sadie Haber, NP      PDMP not reviewed this encounter.   Abran Cantor, NP 12/16/21 1046

## 2021-12-16 NOTE — ED Triage Notes (Signed)
Pt reports cough, nasal congestion and sinus pressure x 2 months. Reports she did 2 rounds of prednisone. Pt reports PCP recommend chest x rays.

## 2022-04-25 ENCOUNTER — Other Ambulatory Visit: Payer: Self-pay | Admitting: Obstetrics and Gynecology

## 2022-04-25 ENCOUNTER — Ambulatory Visit
Admission: RE | Admit: 2022-04-25 | Discharge: 2022-04-25 | Disposition: A | Discharge status: Home / Self Care | Payer: No Typology Code available for payment source | Source: Ambulatory Visit | Attending: Obstetrics and Gynecology | Admitting: Obstetrics and Gynecology

## 2022-04-25 DIAGNOSIS — N6489 Other specified disorders of breast: Secondary | ICD-10-CM

## 2022-06-09 ENCOUNTER — Other Ambulatory Visit: Payer: Self-pay | Admitting: Cardiovascular Disease

## 2022-10-24 ENCOUNTER — Ambulatory Visit
Admission: RE | Admit: 2022-10-24 | Discharge: 2022-10-24 | Disposition: A | Discharge status: Home / Self Care | Payer: No Typology Code available for payment source | Source: Ambulatory Visit | Attending: Obstetrics and Gynecology | Admitting: Obstetrics and Gynecology

## 2022-10-24 DIAGNOSIS — N6489 Other specified disorders of breast: Secondary | ICD-10-CM

## 2022-12-25 ENCOUNTER — Other Ambulatory Visit: Payer: Self-pay | Admitting: Cardiovascular Disease

## 2023-03-03 ENCOUNTER — Ambulatory Visit: Payer: No Typology Code available for payment source

## 2023-03-04 ENCOUNTER — Encounter: Payer: Self-pay | Admitting: Cardiovascular Disease

## 2023-03-04 ENCOUNTER — Ambulatory Visit: Payer: Self-pay

## 2023-03-05 NOTE — Telephone Encounter (Signed)
I would like to know what her blood pressure is when she is not in distress (not seeing someone for a sinus infection, for example).  That she have a blood pressure cuff at home?  Does she have another way of getting her blood pressure checked (at rest, sitting down for at least 15 minutes, not in physical or emotional distress).  If she could give [place] 4-5 days of readings like that, feel better informed about what (if any) blood pressure medication she needs.  Send a log of blood pressure readings through MyChart.

## 2023-04-12 ENCOUNTER — Encounter: Payer: Self-pay | Admitting: Cardiovascular Disease

## 2023-04-13 MED ORDER — FENOFIBRATE 54 MG PO TABS: 54.0000 mg | ORAL_TABLET | Freq: Every day | ORAL | 0 refills | Status: DC

## 2023-04-22 ENCOUNTER — Ambulatory Visit: Payer: Self-pay | Admitting: Cardiovascular Disease

## 2023-04-30 NOTE — Progress Notes (Signed)
Cardiology Office Note    Date:  05/01/2023  ID:  Brittany Sampson, DOB Jul 21, 1980, MRN [id number] PCP:  Cheron Schaumann., MD  Cardiologist:  Thurmon Fair, MD  Electrophysiologist:  None   Chief Complaint: Follow up for POTs  History of Present Illness: .    Brittany Sampson is a 42 y.o. female with visit-pertinent history of postural orthostatic tachycardia syndrome, asymptomatic mitral valve prolapse without MR, borderline prolonged QT interval and hypertriglyceridemia.   Today she presents for follow up. She reports that she has been doing well, she has been very busy in the last year as her daughter was diagnosed with type 1 diabetes.  She denies any chest pain, shortness of breath, lower extremity edema, dizziness, lightheadedness, presyncope or syncope.  She notes that when she is at work her heart rate is regularly in the 120s to 130s bpm, when at home and at rest will be more in the 80s to 90s bpm.  She denies symptoms of this but is overall concerned with how consistently elevated it's been lately.  She notes that she has not been exercising lately, plans to restart.  She reports that she is to have her annual physical with fasting labs next month at her PCP.  ROS: .   Today she denies chest pain, shortness of breath, lower extremity edema, fatigue, palpitations, melena, hematuria, hemoptysis, diaphoresis, weakness, presyncope, syncope, orthopnea, and PND.  All other systems are reviewed and otherwise negative. Studies Reviewed: Marland Kitchen    EKG:  EKG is ordered today, personally reviewed, demonstrating  EKG Interpretation Date/Time:  Friday May 01 2023 15:07:05 EST Ventricular Rate:  106 PR Interval:  148 QRS Duration:  88 QT Interval:  346 QTC Calculation: 459 R Axis:   -9  Text Interpretation: Sinus tachycardia Confirmed by Reather Littler [phone]) on 05/01/2023 6:01:25 PM    CV Studies:  Cardiac Studies & Procedures       ECHOCARDIOGRAM  ECHOCARDIOGRAM COMPLETE  07/19/2021  Narrative ECHOCARDIOGRAM REPORT    Patient Name:   Brittany Sampson Date of Exam: 07/19/2021 Medical Rec #:  [id number]         Height:       62.0 in Accession #:    [id number]        Weight:       178.6 lb Date of Birth:  11-19-1980         BSA:          1.822 m Patient Age:    40 years          BP:           134/90 mmHg Patient Gender: F                 HR:           70 bpm. Exam Location:  [address]  Procedure: 2D Echo and 3D Echo  Indications:    Mitral Valve Prolapse I34.1  History:        Patient has prior history of Echocardiogram examinations, most recent 12/14/2009. Risk Factors:Dyslipidemia and Diabetes.  Sonographer:    Thurman Coyer RDCS Referring Phys: [id number] TESSA N CONTE  IMPRESSIONS   1. Left ventricular ejection fraction, by estimation, is 60 to 65%. The left ventricle has normal function. The left ventricle has no regional wall motion abnormalities. Left ventricular diastolic parameters were normal. 2. Right ventricular systolic function is normal. The right ventricular size is normal. 3. No evidence of mitral valve prolapse. The mitral  valve is normal in structure. Trivial mitral valve regurgitation. No evidence of mitral stenosis. 4. The aortic valve is tricuspid. Aortic valve regurgitation is not visualized. No aortic stenosis is present. 5. The inferior vena cava is normal in size with greater than 50% respiratory variability, suggesting right atrial pressure of 3 mmHg.  FINDINGS Left Ventricle: Left ventricular ejection fraction, by estimation, is 60 to 65%. The left ventricle has normal function. The left ventricle has no regional wall motion abnormalities. The left ventricular internal cavity size was normal in size. There is no left ventricular hypertrophy. Left ventricular diastolic parameters were normal. Normal left ventricular filling pressure.  Right Ventricle: The right ventricular size is normal. No increase in right ventricular wall  thickness. Right ventricular systolic function is normal.  Left Atrium: Left atrial size was normal in size.  Right Atrium: Right atrial size was normal in size.  Pericardium: There is no evidence of pericardial effusion.  Mitral Valve: No evidence of mitral valve prolapse. The mitral valve is normal in structure. Trivial mitral valve regurgitation. No evidence of mitral valve stenosis.  Tricuspid Valve: The tricuspid valve is normal in structure. Tricuspid valve regurgitation is trivial. No evidence of tricuspid stenosis.  Aortic Valve: The aortic valve is tricuspid. Aortic valve regurgitation is not visualized. No aortic stenosis is present.  Pulmonic Valve: The pulmonic valve was normal in structure. Pulmonic valve regurgitation is not visualized. No evidence of pulmonic stenosis.  Aorta: The aortic root is normal in size and structure.  Venous: The inferior vena cava is normal in size with greater than 50% respiratory variability, suggesting right atrial pressure of 3 mmHg.  IAS/Shunts: No atrial level shunt detected by color flow Doppler.   LEFT VENTRICLE PLAX 2D LVIDd:         4.10 cm   Diastology LVIDs:         2.60 cm   LV e' medial:    8.92 cm/s LV PW:         0.80 cm   LV E/e' medial:  8.3 LV IVS:        0.80 cm   LV e' lateral:   8.27 cm/s LVOT diam:     2.00 cm   LV E/e' lateral: 8.9 LV SV:         49 LV SV Index:   27 LVOT Area:     3.14 cm  3D Volume EF: 3D EF:        56 % LV EDV:       79 ml LV ESV:       35 ml LV SV:        44 ml  RIGHT VENTRICLE RV S prime:     11.10 cm/s  LEFT ATRIUM             Index        RIGHT ATRIUM          Index LA diam:        3.20 cm 1.76 cm/m   RA Area:     7.93 cm LA Vol (A2C):   25.0 ml 13.72 ml/m  RA Volume:   13.60 ml 7.46 ml/m LA Vol (A4C):   19.5 ml 10.70 ml/m LA Biplane Vol: 23.9 ml 13.12 ml/m AORTIC VALVE LVOT Vmax:   84.60 cm/s LVOT Vmean:  51.000 cm/s LVOT VTI:    0.157 m  AORTA Ao Root diam: 3.00  cm  MITRAL VALVE  TRICUSPID VALVE MV Area (PHT): 3.83 cm    TR Peak grad:   11.3 mmHg MV Decel Time: 198 msec    TR Vmax:        168.00 cm/s MV E velocity: 73.65 cm/s MV A velocity: 62.90 cm/s  SHUNTS MV E/A ratio:  1.17        Systemic VTI:  0.16 m Systemic Diam: 2.00 cm  Chilton Si MD Electronically signed by Chilton Si MD Signature Date/Time: 07/19/2021/6:33:41 PM    Final              Current Reported Medications:.    Current Meds  Medication Sig   escitalopram (LEXAPRO) 10 MG tablet Take 10 mg by mouth daily.   fenofibrate 54 MG tablet Take 1 tablet (54 mg total) by mouth daily.   metoprolol tartrate (LOPRESSOR) 25 MG tablet Take 0.5 tablets (12.5 mg total) by mouth 2 (two) times daily.   Physical Exam:    VS:  BP 130/88 (BP Location: Left Arm, Patient Position: Sitting, Cuff Size: Normal)   Pulse (!) 110   Ht 5\' 2"  (1.575 m)   Wt 187 lb (84.8 kg)   SpO2 97%   BMI 34.20 kg/m    Wt Readings from Last 3 Encounters:  05/01/23 187 lb (84.8 kg)  07/11/21 178 lb 9.6 oz (81 kg)  03/13/20 177 lb 12.8 oz (80.6 kg)    GEN: Well nourished, well developed in no acute distress NECK: No JVD; No carotid bruits CARDIAC: RRR, no murmurs, rubs, gallops RESPIRATORY:  Clear to auscultation without rales, wheezing or rhonchi  ABDOMEN: Soft, non-tender, non-distended EXTREMITIES:  No edema; No acute deformity   Asessement and Plan:.    POTs: No recent syncope, reports that her symptoms have been overall well controlled.  She does note concern regarding elevations in heart rate when at work in the 120s to 130s bpm.  She notes that her heart rate is always high when at work, she denies symptoms of this.  Patient would like to try metoprolol again.  Start metoprolol tartrate 12.5 mg twice daily.  She will notify the office if she is unable to tolerate this.  MVP: No murmur noted on exam today. Echocardiogram on 07/19/2021 indicated LVEF of 60 to 65%, LV with  normal function, no RWMA, diastolic parameters were normal, no evidence of mitral valve prolapse, mitral valve was normal in structure with trivial mitral valve regurgitation.  Hyperlipidemia: Patient to have annual physical with fasting labs next month with her PCP.  She will send [place] her lab results.  Prolonged QT interval: EKG today indicates QT 346, QTc . Stable on Lexapro.      Disposition: F/u with Dr. Royann Shivers or Reather Littler, NP in six months or sooner if needed.   Signed, Rip Harbour, NP

## 2023-05-01 ENCOUNTER — Encounter: Payer: Self-pay | Admitting: Cardiology

## 2023-05-01 ENCOUNTER — Ambulatory Visit: Payer: 59 | Attending: Cardiology | Admitting: Cardiology

## 2023-05-01 VITALS — BP 130/88 | HR 110 | Ht 62.0 in | Wt 187.0 lb

## 2023-05-01 DIAGNOSIS — G90A Postural orthostatic tachycardia syndrome (POTS): Secondary | ICD-10-CM

## 2023-05-01 DIAGNOSIS — E782 Mixed hyperlipidemia: Secondary | ICD-10-CM | POA: Diagnosis not present

## 2023-05-01 DIAGNOSIS — I341 Nonrheumatic mitral (valve) prolapse: Secondary | ICD-10-CM | POA: Diagnosis not present

## 2023-05-01 DIAGNOSIS — R9431 Abnormal electrocardiogram [ECG] [EKG]: Secondary | ICD-10-CM

## 2023-05-01 MED ORDER — METOPROLOL TARTRATE 25 MG PO TABS: 12.5000 mg | ORAL_TABLET | Freq: Two times a day (BID) | ORAL | 3 refills | Status: DC

## 2023-05-01 NOTE — Patient Instructions (Signed)
Medication Instructions:  Metoprolol Tartrate 12.5 mg twice a day *If you need a refill on your cardiac medications before your next appointment, please call your pharmacy*  Lab Work: No labs  Testing/Procedures: No testing  Follow-Up: At [facility], you and your health needs are our priority.  As part of our continuing mission to provide you with exceptional heart care, we have created designated Provider Care Teams.  These Care Teams include your primary Cardiologist (physician) and Advanced Practice Providers (APPs -  Physician Assistants and Nurse Practitioners) who all work together to provide you with the care you need, when you need it.  We recommend signing up for the patient portal called "MyChart".  Sign up information is provided on this After Visit Summary.  MyChart is used to connect with patients for Virtual Visits (Telemedicine).  Patients are able to view lab/test results, encounter notes, upcoming appointments, etc.  Non-urgent messages can be sent to your provider as well.   To learn more about what you can do with MyChart, go to [email or web address].    Your next appointment:   6 month(s)  Provider:   Thurmon Fair, MD  or Reather Littler, NP

## 2023-05-13 ENCOUNTER — Other Ambulatory Visit: Payer: Self-pay | Admitting: Cardiovascular Disease

## 2023-05-25 ENCOUNTER — Encounter: Payer: Self-pay | Admitting: Cardiovascular Disease

## 2023-07-06 ENCOUNTER — Ambulatory Visit: Payer: 59

## 2023-07-08 ENCOUNTER — Ambulatory Visit: Payer: 59

## 2023-08-20 ENCOUNTER — Encounter: Payer: Self-pay | Admitting: Cardiovascular Disease

## 2023-11-06 ENCOUNTER — Other Ambulatory Visit: Payer: Self-pay | Admitting: Obstetrics and Gynecology

## 2023-11-06 DIAGNOSIS — N6489 Other specified disorders of breast: Secondary | ICD-10-CM

## 2023-12-04 ENCOUNTER — Ambulatory Visit
Admission: RE | Admit: 2023-12-04 | Discharge: 2023-12-04 | Disposition: A | Discharge status: Home / Self Care | Source: Ambulatory Visit | Attending: Obstetrics and Gynecology | Admitting: Obstetrics and Gynecology

## 2023-12-04 DIAGNOSIS — N6489 Other specified disorders of breast: Secondary | ICD-10-CM

## 2024-03-16 ENCOUNTER — Encounter: Payer: Self-pay | Admitting: Cardiovascular Disease

## 2024-03-17 ENCOUNTER — Other Ambulatory Visit: Payer: Self-pay

## 2024-03-17 MED ORDER — METOPROLOL TARTRATE 25 MG PO TABS: 12.5000 mg | ORAL_TABLET | Freq: Two times a day (BID) | ORAL | 0 refills | Status: DC

## 2024-03-17 NOTE — Telephone Encounter (Signed)
 RX sent in

## 2024-04-22 ENCOUNTER — Other Ambulatory Visit: Payer: Self-pay | Admitting: Cardiovascular Disease

## 2024-05-23 ENCOUNTER — Other Ambulatory Visit: Payer: Self-pay | Admitting: Cardiovascular Disease

## 2024-05-27 ENCOUNTER — Encounter: Payer: Self-pay | Admitting: Cardiovascular Disease

## 2024-06-13 ENCOUNTER — Other Ambulatory Visit: Payer: Self-pay | Admitting: Cardiovascular Disease

## 2024-07-20 ENCOUNTER — Ambulatory Visit: Payer: Self-pay | Admitting: Cardiology

## 2024-07-20 ENCOUNTER — Encounter: Payer: Self-pay | Admitting: Cardiology

## 2024-07-20 VITALS — BP 112/74 | HR 101 | Ht 62.0 in | Wt 190.6 lb

## 2024-07-20 DIAGNOSIS — R9431 Abnormal electrocardiogram [ECG] [EKG]: Secondary | ICD-10-CM

## 2024-07-20 DIAGNOSIS — I341 Nonrheumatic mitral (valve) prolapse: Secondary | ICD-10-CM | POA: Diagnosis not present

## 2024-07-20 DIAGNOSIS — G90A Postural orthostatic tachycardia syndrome (POTS): Secondary | ICD-10-CM

## 2024-07-20 DIAGNOSIS — E782 Mixed hyperlipidemia: Secondary | ICD-10-CM

## 2024-07-20 MED ORDER — METOPROLOL TARTRATE 25 MG PO TABS: 12.5000 mg | ORAL_TABLET | Freq: Two times a day (BID) | ORAL | 3 refills | Status: AC

## 2024-07-20 NOTE — Patient Instructions (Signed)
 Medication Instructions:  Your physician recommends that you continue on your current medications as directed. Please refer to the Current Medication list given to you today.  *If you need a refill on your cardiac medications before your next appointment, please call your pharmacy*  Lab Work: NONE If you have labs (blood work) drawn today and your tests are completely normal, you will receive your results only by: MyChart Message (if you have MyChart) OR A paper copy in the mail If you have any lab test that is abnormal or we need to change your treatment, we will call you to review the results.  Testing/Procedures: NONE  Follow-Up: At [facility], you and your health needs are our priority.  As part of our continuing mission to provide you with exceptional heart care, our providers are all part of one team.  This team includes your primary Cardiologist (physician) and Advanced Practice Providers or APPs (Physician Assistants and Nurse Practitioners) who all work together to provide you with the care you need, when you need it.  Your next appointment:   1 year(s)  Provider:   Jerel Balding, MD or Katlyn West, NP
